# Patient Record
Sex: Female | Born: 1935
Health system: Southern US, Community
[De-identification: ages and names within clinical notes are randomized; demographics above are authoritative.]

## PROBLEM LIST (undated history)

## (undated) DIAGNOSIS — N289 Disorder of kidney and ureter, unspecified: Secondary | ICD-10-CM

## (undated) DIAGNOSIS — E785 Hyperlipidemia, unspecified: Secondary | ICD-10-CM

## (undated) DIAGNOSIS — R002 Palpitations: Secondary | ICD-10-CM

## (undated) DIAGNOSIS — M1990 Unspecified osteoarthritis, unspecified site: Secondary | ICD-10-CM

## (undated) DIAGNOSIS — J189 Pneumonia, unspecified organism: Secondary | ICD-10-CM

## (undated) DIAGNOSIS — R011 Cardiac murmur, unspecified: Secondary | ICD-10-CM

## (undated) DIAGNOSIS — I2510 Atherosclerotic heart disease of native coronary artery without angina pectoris: Secondary | ICD-10-CM

## (undated) DIAGNOSIS — M419 Scoliosis, unspecified: Secondary | ICD-10-CM

## (undated) DIAGNOSIS — IMO0002 Reserved for concepts with insufficient information to code with codable children: Secondary | ICD-10-CM

## (undated) DIAGNOSIS — J309 Allergic rhinitis, unspecified: Secondary | ICD-10-CM

## (undated) DIAGNOSIS — I10 Essential (primary) hypertension: Secondary | ICD-10-CM

## (undated) HISTORY — PX: HEMORRHOID SURGERY: SHX153

## (undated) HISTORY — DX: Pneumonia, unspecified organism: J18.9

## (undated) HISTORY — PX: OTHER SURGICAL HISTORY: SHX169

## (undated) HISTORY — PX: APPENDECTOMY: SHX54

## (undated) HISTORY — DX: Disorder of kidney and ureter, unspecified: N28.9

## (undated) HISTORY — DX: Atherosclerotic heart disease of native coronary artery without angina pectoris: I25.10

## (undated) HISTORY — PX: TONSILLECTOMY: SUR1361

## (undated) HISTORY — PX: ABDOMINAL SURGERY: SHX537

## (undated) HISTORY — DX: Cardiac murmur, unspecified: R01.1

## (undated) HISTORY — DX: Hyperlipidemia, unspecified: E78.5

## (undated) HISTORY — DX: Essential (primary) hypertension: I10

## (undated) HISTORY — PX: ABDOMINAL HYSTERECTOMY: SHX81

## (undated) HISTORY — DX: Reserved for concepts with insufficient information to code with codable children: IMO0002

## (undated) HISTORY — PX: CARPAL TUNNEL RELEASE: SHX101

## (undated) HISTORY — DX: Allergic rhinitis, unspecified: J30.9

## (undated) HISTORY — DX: Palpitations: R00.2

## (undated) HISTORY — PX: COLON SURGERY: SHX602

## (undated) HISTORY — DX: Unspecified osteoarthritis, unspecified site: M19.90

## (undated) HISTORY — DX: Scoliosis, unspecified: M41.9

## (undated) HISTORY — PX: FOOT SURGERY: SHX648

---

## 1997-02-02 HISTORY — PX: BACK SURGERY: SHX140

## 2001-10-10 ENCOUNTER — Other Ambulatory Visit: Admission: RE | Admit: 2001-10-10 | Discharge: 2001-10-10 | Payer: Self-pay | Admitting: Family Medicine

## 2001-10-10 ENCOUNTER — Encounter: Payer: Self-pay | Admitting: Family Medicine

## 2001-10-10 LAB — CONVERTED CEMR LAB: Pap Smear: NORMAL

## 2001-10-17 ENCOUNTER — Encounter: Admission: RE | Admit: 2001-10-17 | Discharge: 2001-10-17 | Payer: Self-pay | Admitting: General Surgery

## 2001-10-17 ENCOUNTER — Encounter: Payer: Self-pay | Admitting: General Surgery

## 2001-11-16 ENCOUNTER — Encounter: Payer: Self-pay | Admitting: General Surgery

## 2001-11-18 ENCOUNTER — Ambulatory Visit (HOSPITAL_COMMUNITY): Admission: RE | Admit: 2001-11-18 | Discharge: 2001-11-18 | Payer: Self-pay | Admitting: General Surgery

## 2003-12-13 ENCOUNTER — Ambulatory Visit: Payer: Self-pay | Admitting: Family Medicine

## 2003-12-19 ENCOUNTER — Ambulatory Visit: Payer: Self-pay | Admitting: Family Medicine

## 2004-06-25 ENCOUNTER — Ambulatory Visit: Payer: Self-pay | Admitting: Family Medicine

## 2004-06-27 ENCOUNTER — Ambulatory Visit: Payer: Self-pay | Admitting: Family Medicine

## 2004-07-30 ENCOUNTER — Ambulatory Visit: Payer: Self-pay | Admitting: Family Medicine

## 2004-11-28 ENCOUNTER — Ambulatory Visit: Payer: Self-pay | Admitting: Internal Medicine

## 2005-09-17 ENCOUNTER — Ambulatory Visit: Payer: Self-pay | Admitting: Family Medicine

## 2005-10-19 ENCOUNTER — Ambulatory Visit: Payer: Self-pay | Admitting: Family Medicine

## 2005-11-16 ENCOUNTER — Ambulatory Visit: Payer: Self-pay | Admitting: Family Medicine

## 2006-02-01 ENCOUNTER — Ambulatory Visit: Payer: Self-pay | Admitting: Family Medicine

## 2006-02-01 ENCOUNTER — Encounter: Admission: RE | Admit: 2006-02-01 | Discharge: 2006-02-01 | Payer: Self-pay | Admitting: Family Medicine

## 2006-02-02 HISTORY — PX: CORONARY ANGIOPLASTY: SHX604

## 2006-02-03 ENCOUNTER — Encounter: Admission: RE | Admit: 2006-02-03 | Discharge: 2006-02-03 | Payer: Self-pay | Admitting: Family Medicine

## 2006-06-03 ENCOUNTER — Encounter: Payer: Self-pay | Admitting: Family Medicine

## 2006-06-05 ENCOUNTER — Emergency Department: Payer: Self-pay | Admitting: Emergency Medicine

## 2006-06-17 ENCOUNTER — Ambulatory Visit: Payer: Self-pay | Admitting: Family Medicine

## 2006-06-21 ENCOUNTER — Encounter: Payer: Self-pay | Admitting: Family Medicine

## 2006-06-21 DIAGNOSIS — N6019 Diffuse cystic mastopathy of unspecified breast: Secondary | ICD-10-CM

## 2006-06-21 DIAGNOSIS — IMO0002 Reserved for concepts with insufficient information to code with codable children: Secondary | ICD-10-CM

## 2006-06-21 DIAGNOSIS — J309 Allergic rhinitis, unspecified: Secondary | ICD-10-CM | POA: Insufficient documentation

## 2006-06-21 DIAGNOSIS — N3941 Urge incontinence: Secondary | ICD-10-CM

## 2006-06-21 DIAGNOSIS — E785 Hyperlipidemia, unspecified: Secondary | ICD-10-CM

## 2006-06-21 DIAGNOSIS — Z9189 Other specified personal risk factors, not elsewhere classified: Secondary | ICD-10-CM | POA: Insufficient documentation

## 2006-06-21 DIAGNOSIS — Z8709 Personal history of other diseases of the respiratory system: Secondary | ICD-10-CM | POA: Insufficient documentation

## 2006-06-21 DIAGNOSIS — I10 Essential (primary) hypertension: Secondary | ICD-10-CM | POA: Insufficient documentation

## 2006-06-22 ENCOUNTER — Ambulatory Visit: Payer: Self-pay | Admitting: Family Medicine

## 2006-06-29 ENCOUNTER — Ambulatory Visit: Payer: Self-pay | Admitting: Family Medicine

## 2006-06-29 DIAGNOSIS — R002 Palpitations: Secondary | ICD-10-CM | POA: Insufficient documentation

## 2006-06-29 DIAGNOSIS — R5383 Other fatigue: Secondary | ICD-10-CM

## 2006-06-29 DIAGNOSIS — R5381 Other malaise: Secondary | ICD-10-CM

## 2006-06-29 DIAGNOSIS — R0989 Other specified symptoms and signs involving the circulatory and respiratory systems: Secondary | ICD-10-CM

## 2006-06-29 DIAGNOSIS — R0609 Other forms of dyspnea: Secondary | ICD-10-CM

## 2006-07-02 ENCOUNTER — Ambulatory Visit: Payer: Self-pay | Admitting: Internal Medicine

## 2006-07-08 ENCOUNTER — Ambulatory Visit: Payer: Self-pay | Admitting: Internal Medicine

## 2006-07-08 ENCOUNTER — Inpatient Hospital Stay (HOSPITAL_BASED_OUTPATIENT_CLINIC_OR_DEPARTMENT_OTHER): Admission: RE | Admit: 2006-07-08 | Discharge: 2006-07-08 | Payer: Self-pay | Admitting: Internal Medicine

## 2006-07-18 ENCOUNTER — Ambulatory Visit: Payer: Self-pay | Admitting: Internal Medicine

## 2006-07-20 ENCOUNTER — Ambulatory Visit: Payer: Self-pay

## 2006-07-20 ENCOUNTER — Encounter: Payer: Self-pay | Admitting: Internal Medicine

## 2006-07-23 ENCOUNTER — Telehealth: Payer: Self-pay | Admitting: Family Medicine

## 2006-07-26 ENCOUNTER — Ambulatory Visit: Payer: Self-pay

## 2006-07-28 ENCOUNTER — Ambulatory Visit: Payer: Self-pay

## 2006-07-28 ENCOUNTER — Ambulatory Visit: Payer: Self-pay | Admitting: Emergency Medicine

## 2006-08-09 ENCOUNTER — Ambulatory Visit: Payer: Self-pay | Admitting: Family Medicine

## 2006-08-09 DIAGNOSIS — R519 Headache, unspecified: Secondary | ICD-10-CM | POA: Insufficient documentation

## 2006-08-09 DIAGNOSIS — R51 Headache: Secondary | ICD-10-CM

## 2006-08-23 ENCOUNTER — Ambulatory Visit: Payer: Self-pay | Admitting: Family Medicine

## 2006-10-20 ENCOUNTER — Ambulatory Visit: Payer: Self-pay | Admitting: Family Medicine

## 2006-10-20 DIAGNOSIS — M7120 Synovial cyst of popliteal space [Baker], unspecified knee: Secondary | ICD-10-CM | POA: Insufficient documentation

## 2006-10-28 ENCOUNTER — Ambulatory Visit: Payer: Self-pay | Admitting: Internal Medicine

## 2006-11-17 ENCOUNTER — Encounter: Payer: Self-pay | Admitting: Family Medicine

## 2006-12-01 ENCOUNTER — Encounter: Payer: Self-pay | Admitting: Family Medicine

## 2006-12-02 ENCOUNTER — Encounter: Admission: RE | Admit: 2006-12-02 | Discharge: 2006-12-02 | Payer: Self-pay | Admitting: Orthopedic Surgery

## 2007-01-11 ENCOUNTER — Encounter: Payer: Self-pay | Admitting: Family Medicine

## 2007-01-14 ENCOUNTER — Encounter: Payer: Self-pay | Admitting: Family Medicine

## 2007-02-03 HISTORY — PX: TOTAL HIP ARTHROPLASTY: SHX124

## 2007-06-14 ENCOUNTER — Ambulatory Visit: Payer: Self-pay | Admitting: Family Medicine

## 2007-06-14 DIAGNOSIS — R609 Edema, unspecified: Secondary | ICD-10-CM | POA: Insufficient documentation

## 2007-06-16 LAB — CONVERTED CEMR LAB
ALT: 20 units/L (ref 0–35)
BUN: 23 mg/dL (ref 6–23)
Bilirubin, Direct: 0.1 mg/dL (ref 0.0–0.3)
CO2: 30 meq/L (ref 19–32)
Calcium: 9.1 mg/dL (ref 8.4–10.5)
Cholesterol: 239 mg/dL (ref 0–200)
Eosinophils Absolute: 0.1 10*3/uL (ref 0.0–0.7)
Eosinophils Relative: 1.4 % (ref 0.0–5.0)
GFR calc Af Amer: 68 mL/min
GFR calc non Af Amer: 56 mL/min
Glucose, Bld: 85 mg/dL (ref 70–99)
MCHC: 34.4 g/dL (ref 30.0–36.0)
MCV: 95.6 fL (ref 78.0–100.0)
Monocytes Absolute: 0.4 10*3/uL (ref 0.1–1.0)
Neutrophils Relative %: 75.6 % (ref 43.0–77.0)
Platelets: 267 10*3/uL (ref 150–400)
RDW: 12.2 % (ref 11.5–14.6)
Total CHOL/HDL Ratio: 4.1

## 2007-07-15 ENCOUNTER — Ambulatory Visit: Payer: Self-pay | Admitting: Family Medicine

## 2007-09-14 ENCOUNTER — Telehealth (INDEPENDENT_AMBULATORY_CARE_PROVIDER_SITE_OTHER): Payer: Self-pay | Admitting: *Deleted

## 2007-10-11 ENCOUNTER — Ambulatory Visit: Payer: Self-pay | Admitting: Family Medicine

## 2007-10-12 ENCOUNTER — Ambulatory Visit: Payer: Self-pay | Admitting: Family Medicine

## 2007-10-12 LAB — CONVERTED CEMR LAB
AST: 28 units/L (ref 0–37)
CO2: 31 meq/L (ref 19–32)
Calcium: 9.4 mg/dL (ref 8.4–10.5)
Chloride: 104 meq/L (ref 96–112)
Creatinine, Ser: 1.1 mg/dL (ref 0.4–1.2)
GFR calc non Af Amer: 50 mL/min
Glucose, Bld: 97 mg/dL (ref 70–99)
HDL: 56.2 mg/dL (ref >39.0)
Phosphorus: 4.8 mg/dL — ABNORMAL HIGH (ref 2.3–4.6)

## 2007-10-31 ENCOUNTER — Ambulatory Visit: Payer: Self-pay | Admitting: Family Medicine

## 2007-11-14 ENCOUNTER — Ambulatory Visit: Payer: Self-pay | Admitting: Family Medicine

## 2007-12-05 ENCOUNTER — Encounter: Payer: Self-pay | Admitting: Family Medicine

## 2007-12-13 ENCOUNTER — Ambulatory Visit: Payer: Self-pay | Admitting: Family Medicine

## 2008-01-02 ENCOUNTER — Inpatient Hospital Stay (HOSPITAL_COMMUNITY): Admission: RE | Admit: 2008-01-02 | Discharge: 2008-01-05 | Payer: Self-pay | Admitting: Orthopedic Surgery

## 2008-01-02 ENCOUNTER — Ambulatory Visit: Payer: Self-pay | Admitting: Cardiology

## 2008-02-03 HISTORY — PX: CATARACT EXTRACTION: SUR2

## 2008-04-18 ENCOUNTER — Ambulatory Visit: Payer: Self-pay | Admitting: Ophthalmology

## 2008-04-18 ENCOUNTER — Encounter: Payer: Self-pay | Admitting: Family Medicine

## 2008-05-02 ENCOUNTER — Ambulatory Visit: Payer: Self-pay | Admitting: Ophthalmology

## 2008-05-15 ENCOUNTER — Ambulatory Visit: Payer: Self-pay | Admitting: Family Medicine

## 2008-08-02 ENCOUNTER — Ambulatory Visit: Payer: Self-pay | Admitting: Ophthalmology

## 2008-08-13 ENCOUNTER — Ambulatory Visit: Payer: Self-pay | Admitting: Ophthalmology

## 2008-09-20 ENCOUNTER — Emergency Department (HOSPITAL_COMMUNITY): Admission: EM | Admit: 2008-09-20 | Discharge: 2008-09-20 | Payer: Self-pay | Admitting: Emergency Medicine

## 2008-09-20 ENCOUNTER — Telehealth: Payer: Self-pay | Admitting: Family Medicine

## 2008-09-21 ENCOUNTER — Telehealth: Payer: Self-pay | Admitting: Family Medicine

## 2008-09-21 DIAGNOSIS — R55 Syncope and collapse: Secondary | ICD-10-CM

## 2008-09-28 ENCOUNTER — Ambulatory Visit: Payer: Self-pay | Admitting: Internal Medicine

## 2008-10-01 ENCOUNTER — Ambulatory Visit: Payer: Self-pay | Admitting: Internal Medicine

## 2008-10-01 ENCOUNTER — Encounter: Payer: Self-pay | Admitting: Internal Medicine

## 2008-10-01 DIAGNOSIS — R42 Dizziness and giddiness: Secondary | ICD-10-CM

## 2008-10-09 ENCOUNTER — Ambulatory Visit: Payer: Self-pay | Admitting: Cardiovascular Disease

## 2008-10-09 ENCOUNTER — Encounter: Payer: Self-pay | Admitting: Family Medicine

## 2008-10-11 LAB — CONVERTED CEMR LAB
CO2: 22 meq/L (ref 19–32)
Calcium: 9.1 mg/dL (ref 8.4–10.5)
Chloride: 105 meq/L (ref 96–112)
Creatinine, Ser: 1.12 mg/dL (ref 0.40–1.20)
MCV: 95.8 fL (ref 78.0–100.0)
Potassium: 4.5 meq/L (ref 3.5–5.3)
RBC: 4.31 M/uL (ref 3.87–5.11)
Sodium: 141 meq/L (ref 135–145)

## 2008-10-12 ENCOUNTER — Ambulatory Visit (HOSPITAL_COMMUNITY): Admission: RE | Admit: 2008-10-12 | Discharge: 2008-10-12 | Payer: Self-pay | Admitting: Internal Medicine

## 2008-10-12 ENCOUNTER — Ambulatory Visit: Payer: Self-pay | Admitting: Internal Medicine

## 2008-10-15 ENCOUNTER — Encounter: Payer: Self-pay | Admitting: Internal Medicine

## 2008-10-16 ENCOUNTER — Ambulatory Visit: Payer: Self-pay

## 2008-10-16 ENCOUNTER — Encounter: Payer: Self-pay | Admitting: Internal Medicine

## 2008-10-22 ENCOUNTER — Ambulatory Visit: Payer: Self-pay | Admitting: Internal Medicine

## 2008-11-13 ENCOUNTER — Encounter (INDEPENDENT_AMBULATORY_CARE_PROVIDER_SITE_OTHER): Payer: Self-pay | Admitting: *Deleted

## 2008-12-17 ENCOUNTER — Ambulatory Visit: Payer: Self-pay | Admitting: Family Medicine

## 2008-12-18 LAB — CONVERTED CEMR LAB
Albumin: 4.2 g/dL (ref 3.5–5.2)
BUN: 30 mg/dL — ABNORMAL HIGH (ref 6–23)
Direct LDL: 192.4 mg/dL
Folate: >20 ng/mL
Glucose, Bld: 86 mg/dL (ref 70–99)
HDL: 53.9 mg/dL (ref >39.00)
Lymphocytes Relative: 21.3 % (ref 12.0–46.0)
MCHC: 34.3 g/dL (ref 30.0–36.0)
MCV: 95 fL (ref 78.0–100.0)
Monocytes Absolute: 0.6 10*3/uL (ref 0.1–1.0)
RBC: 4.12 M/uL (ref 3.87–5.11)
Triglycerides: 146 mg/dL (ref 0.0–149.0)
Vit D, 25-Hydroxy: 22 ng/mL — ABNORMAL LOW (ref 30–89)
Vitamin B-12: >1500 pg/mL — ABNORMAL HIGH (ref 211–911)
WBC: 6.5 10*3/uL (ref 4.5–10.5)

## 2008-12-31 ENCOUNTER — Ambulatory Visit: Payer: Self-pay | Admitting: Family Medicine

## 2008-12-31 DIAGNOSIS — E559 Vitamin D deficiency, unspecified: Secondary | ICD-10-CM | POA: Insufficient documentation

## 2008-12-31 DIAGNOSIS — N259 Disorder resulting from impaired renal tubular function, unspecified: Secondary | ICD-10-CM | POA: Insufficient documentation

## 2008-12-31 LAB — CONVERTED CEMR LAB
Bilirubin Urine: NEGATIVE
Glucose, Urine, Semiquant: NEGATIVE
Urobilinogen, UA: 0.2

## 2009-01-01 LAB — CONVERTED CEMR LAB
Albumin: 4.1 g/dL (ref 3.5–5.2)
CO2: 29 meq/L (ref 19–32)
Creatinine, Ser: 1.1 mg/dL (ref 0.4–1.2)
GFR calc non Af Amer: 50 mL/min (ref >60)
Glucose, Bld: 81 mg/dL (ref 70–99)
Phosphorus: 4.6 mg/dL (ref 2.3–4.6)

## 2009-03-18 ENCOUNTER — Ambulatory Visit: Payer: Self-pay | Admitting: Internal Medicine

## 2009-05-01 ENCOUNTER — Ambulatory Visit: Payer: Self-pay | Admitting: Family Medicine

## 2009-05-01 DIAGNOSIS — I872 Venous insufficiency (chronic) (peripheral): Secondary | ICD-10-CM | POA: Insufficient documentation

## 2009-05-03 LAB — CONVERTED CEMR LAB
ALT: 15 units/L (ref 0–35)
Calcium: 9.4 mg/dL (ref 8.4–10.5)
Chloride: 104 meq/L (ref 96–112)
GFR calc non Af Amer: 45.19 mL/min (ref >60)
Phosphorus: 3.8 mg/dL (ref 2.3–4.6)
Potassium: 4.6 meq/L (ref 3.5–5.1)
Total CHOL/HDL Ratio: 4

## 2009-05-13 ENCOUNTER — Telehealth (INDEPENDENT_AMBULATORY_CARE_PROVIDER_SITE_OTHER): Payer: Self-pay | Admitting: *Deleted

## 2009-05-13 ENCOUNTER — Ambulatory Visit: Payer: Self-pay | Admitting: Family Medicine

## 2009-05-14 ENCOUNTER — Ambulatory Visit: Payer: Self-pay | Admitting: Cardiovascular Disease

## 2009-05-14 ENCOUNTER — Encounter (INDEPENDENT_AMBULATORY_CARE_PROVIDER_SITE_OTHER): Payer: Self-pay | Admitting: *Deleted

## 2009-05-14 ENCOUNTER — Ambulatory Visit: Payer: Self-pay | Admitting: Gastroenterology

## 2009-05-14 ENCOUNTER — Encounter: Payer: Self-pay | Admitting: Physician Assistant

## 2009-05-14 DIAGNOSIS — K5900 Constipation, unspecified: Secondary | ICD-10-CM | POA: Insufficient documentation

## 2009-05-14 DIAGNOSIS — I2510 Atherosclerotic heart disease of native coronary artery without angina pectoris: Secondary | ICD-10-CM | POA: Insufficient documentation

## 2009-05-15 ENCOUNTER — Telehealth: Payer: Self-pay | Admitting: Physician Assistant

## 2009-05-16 ENCOUNTER — Encounter: Payer: Self-pay | Admitting: Gastroenterology

## 2009-05-16 ENCOUNTER — Encounter: Payer: Self-pay | Admitting: Physician Assistant

## 2009-05-17 LAB — CONVERTED CEMR LAB
ALT: 19 units/L (ref 0–35)
AST: 28 units/L (ref 0–37)
Bilirubin Urine: NEGATIVE
CO2: 31 meq/L (ref 19–32)
Chloride: 104 meq/L (ref 96–112)
Creatinine, Ser: 1.1 mg/dL (ref 0.4–1.2)
Eosinophils Absolute: 0.1 10*3/uL (ref 0.0–0.7)
Ketones, ur: NEGATIVE mg/dL
Lymphocytes Relative: 12.3 % (ref 12.0–46.0)
MCHC: 34.1 g/dL (ref 30.0–36.0)
MCV: 92.8 fL (ref 78.0–100.0)
Monocytes Relative: 6.8 % (ref 3.0–12.0)
Neutrophils Relative %: 79.6 % — ABNORMAL HIGH (ref 43.0–77.0)
Nitrite: NEGATIVE
Platelets: 286 10*3/uL (ref 150.0–400.0)
RDW: 13.9 % (ref 11.5–14.6)
Sodium: 143 meq/L (ref 135–145)
Total Bilirubin: 0.7 mg/dL (ref 0.3–1.2)
Urine Glucose: NEGATIVE mg/dL
Urobilinogen, UA: 0.2 (ref 0.0–1.0)
pH: 6.5 (ref 5.0–8.0)

## 2009-05-23 ENCOUNTER — Telehealth: Payer: Self-pay | Admitting: Gastroenterology

## 2009-05-29 ENCOUNTER — Ambulatory Visit: Payer: Self-pay | Admitting: Gastroenterology

## 2009-06-03 ENCOUNTER — Telehealth (INDEPENDENT_AMBULATORY_CARE_PROVIDER_SITE_OTHER): Payer: Self-pay | Admitting: *Deleted

## 2009-06-07 ENCOUNTER — Ambulatory Visit: Payer: Self-pay | Admitting: Gastroenterology

## 2009-06-07 ENCOUNTER — Encounter (INDEPENDENT_AMBULATORY_CARE_PROVIDER_SITE_OTHER): Payer: Self-pay | Admitting: *Deleted

## 2009-06-20 ENCOUNTER — Ambulatory Visit (HOSPITAL_COMMUNITY): Admission: RE | Admit: 2009-06-20 | Discharge: 2009-06-20 | Payer: Self-pay | Admitting: Gastroenterology

## 2009-06-20 ENCOUNTER — Ambulatory Visit: Payer: Self-pay | Admitting: Gastroenterology

## 2009-06-24 ENCOUNTER — Encounter: Payer: Self-pay | Admitting: Gastroenterology

## 2009-09-27 ENCOUNTER — Encounter (INDEPENDENT_AMBULATORY_CARE_PROVIDER_SITE_OTHER): Payer: Self-pay | Admitting: *Deleted

## 2009-10-15 ENCOUNTER — Ambulatory Visit: Payer: Self-pay | Admitting: Family Medicine

## 2009-10-15 ENCOUNTER — Encounter: Payer: Self-pay | Admitting: Gastroenterology

## 2009-10-15 DIAGNOSIS — M25519 Pain in unspecified shoulder: Secondary | ICD-10-CM

## 2009-10-15 DIAGNOSIS — R7309 Other abnormal glucose: Secondary | ICD-10-CM

## 2009-10-16 LAB — CONVERTED CEMR LAB
ALT: 16 units/L (ref 0–35)
AST: 27 units/L (ref 0–37)
Albumin: 3.9 g/dL (ref 3.5–5.2)
BUN: 27 mg/dL — ABNORMAL HIGH (ref 6–23)
Calcium: 9.2 mg/dL (ref 8.4–10.5)
GFR calc non Af Amer: 45.14 mL/min (ref >60)
Glucose, Bld: 84 mg/dL (ref 70–99)
Phosphorus: 4 mg/dL (ref 2.3–4.6)
VLDL: 25 mg/dL (ref 0.0–40.0)

## 2009-10-30 ENCOUNTER — Encounter: Payer: Self-pay | Admitting: Family Medicine

## 2009-11-01 ENCOUNTER — Encounter (INDEPENDENT_AMBULATORY_CARE_PROVIDER_SITE_OTHER): Payer: Self-pay | Admitting: *Deleted

## 2009-11-14 ENCOUNTER — Telehealth: Payer: Self-pay | Admitting: Family Medicine

## 2009-12-06 ENCOUNTER — Ambulatory Visit: Payer: Self-pay | Admitting: Gastroenterology

## 2009-12-06 ENCOUNTER — Encounter (INDEPENDENT_AMBULATORY_CARE_PROVIDER_SITE_OTHER): Payer: Self-pay | Admitting: *Deleted

## 2009-12-06 DIAGNOSIS — Z860100 Personal history of colon polyps, unspecified: Secondary | ICD-10-CM | POA: Insufficient documentation

## 2010-01-19 ENCOUNTER — Inpatient Hospital Stay (HOSPITAL_COMMUNITY): Admission: EM | Admit: 2010-01-19 | Discharge: 2010-01-22 | Payer: Self-pay | Source: Home / Self Care

## 2010-01-19 ENCOUNTER — Encounter: Payer: Self-pay | Admitting: Family Medicine

## 2010-01-20 ENCOUNTER — Encounter: Payer: Self-pay | Admitting: Family Medicine

## 2010-01-20 ENCOUNTER — Telehealth: Payer: Self-pay | Admitting: Family Medicine

## 2010-01-22 ENCOUNTER — Encounter: Payer: Self-pay | Admitting: Family Medicine

## 2010-02-02 HISTORY — PX: HERNIA REPAIR: SHX51

## 2010-02-04 ENCOUNTER — Ambulatory Visit: Admit: 2010-02-04 | Payer: Self-pay | Admitting: Gastroenterology

## 2010-02-27 ENCOUNTER — Telehealth: Payer: Self-pay | Admitting: Family Medicine

## 2010-02-28 ENCOUNTER — Encounter: Payer: Self-pay | Admitting: Family Medicine

## 2010-02-28 ENCOUNTER — Encounter
Admission: RE | Admit: 2010-02-28 | Discharge: 2010-02-28 | Payer: Self-pay | Source: Home / Self Care | Attending: General Surgery | Admitting: General Surgery

## 2010-03-02 LAB — CONVERTED CEMR LAB
BUN: 24 mg/dL — ABNORMAL HIGH (ref 6–23)
Basophils Relative: 0.8 % (ref 0.0–1.0)
CO2: 31 meq/L (ref 19–32)
Chloride: 105 meq/L (ref 96–112)
Cholesterol: 230 mg/dL (ref 0–200)
Direct LDL: 143.8 mg/dL
Eosinophils Absolute: 0.2 10*3/uL (ref 0.0–0.6)
Eosinophils Relative: 2.9 % (ref 0.0–5.0)
GFR calc Af Amer: 55 mL/min
GFR calc non Af Amer: 46 mL/min
Glucose, Bld: 90 mg/dL (ref 70–99)
HCT: 37.4 % (ref 36.0–46.0)
MCHC: 34.4 g/dL (ref 30.0–36.0)
Neutrophils Relative %: 65.8 % (ref 43.0–77.0)
Phosphorus: 4.5 mg/dL (ref 2.3–4.6)
Potassium: 4.3 meq/L (ref 3.5–5.1)
RDW: 15.6 % — ABNORMAL HIGH (ref 11.5–14.6)
TSH: 1.16 microintl units/mL (ref 0.35–5.50)
Total CHOL/HDL Ratio: 5
Triglycerides: 155 mg/dL — ABNORMAL HIGH (ref 0–149)
VLDL: 31 mg/dL (ref 0–40)

## 2010-03-04 NOTE — Procedures (Signed)
Summary: Instruction for procedure/[facility] (out pt)  Instruction for procedure/[facility] (out pt)   Imported By: Sherian Rein 06/11/2009 11:04:05  _____________________________________________________________________  External Attachment:    Type:   Image     Comment:   External Document

## 2010-03-04 NOTE — Miscellaneous (Signed)
Summary: RX MOVIPREP  Clinical Lists Changes  Medications: Added new medication of MOVIPREP 100 GM  SOLR (PEG-KCL-NACL-NASULF-NA ASC-C) As per prep instructions. - Signed Rx of MOVIPREP 100 GM  SOLR (PEG-KCL-NACL-NASULF-NA ASC-C) As per prep instructions.;  #1 x 0;  Signed;  Entered by: Lowry Ram NCMA;  Authorized by: Sammuel Cooper PA-c;  Method used: Electronically to [facility] [phone]*, [address] [place], [place], [place]  [address], Ph: [phone], Fax: [phone]    Prescriptions: MOVIPREP 100 GM  SOLR (PEG-KCL-NACL-NASULF-NA ASC-C) As per prep instructions.  #1 x 0   Entered by:   Lowry Ram NCMA   Authorized by:   Sammuel Cooper PA-c   Signed by:   Lowry Ram NCMA on 05/16/2009   Method used:   Electronically to        [facility] [phone]* (retail)       [address] [place], [place]  [address]       Ph: [phone]       Fax: [phone]   RxID:   [id number]

## 2010-03-04 NOTE — Progress Notes (Signed)
Summary: needs order for zostavax  Phone Note From Pharmacy   Caller: [facility]  [place]. [phone] Reason for Call: Allergy Alert Summary of Call: Pt is requesting to get zostavax at [facility], please send order. Initial call taken by: Lowella Petties CMA,  November 14, 2009 2:39 PM  Follow-up for Phone Call        px written on EMR for call in  Follow-up by: Judith Part MD,  November 15, 2009 9:45 AM  Additional Follow-up for Phone Call Additional follow up Details #1::        Medication phoned to [facility] [phone] pharmacy as instructed. The note for reason for call allergy alert, spoke with Jacki Cones and she said that was error.Lewanda Rife LPN  November 15, 2009 9:58 AM     New/Updated Medications: ZOSTAVAX 82956 UNT/0.65ML SOLR (ZOSTER VACCINE LIVE) inject times one as directed Prescriptions: ZOSTAVAX 21308 UNT/0.65ML SOLR (ZOSTER VACCINE LIVE) inject times one as directed  #1 x 0   Entered and Authorized by:   Judith Part MD   Signed by:   Lewanda Rife LPN on 65/78/4696   Method used:   Telephoned to ...       [facility] [phone]* (retail)       [address] [place], [place]  [address]       Ph: [phone]       Fax: [phone]   RxID:   [id number]

## 2010-03-04 NOTE — Assessment & Plan Note (Signed)
Summary: FOLLOW UP / LFW   Vital Signs:  Patient profile:   77 year old female Height:      59.75 inches Weight:      140 pounds Temp:     98.2 degrees F oral Pulse rate:   60 / minute Pulse rhythm:   regular BP sitting:   140 / 70  (left arm) Cuff size:   regular  Vitals Entered By: Linde Gillis CMA Duncan Dull) (May 26, 2009 1:50 PM) CC: follow-up visit   History of Present Illness: here for f/u of HTN and lipids and renal insuff  has had some syncope with cardiac w/u  no events found hesitant to get better bp control in light of labile bp unless sytolic exceeds 045W   wt is up 6 lb-- is swelling just a bit in her ankles  also eating more than usual  too much bread   due for chol check Last Lipid ProfileCholesterol: 263 (12/17/2008 11:58:27 AM)HDL:  53.90 (12/17/2008 11:58:27 AM)LDL:  118 (10/11/2007 8:59:00 AM)Triglycerides:  Last Liver profileSGOT:  28 (10/11/2007 8:59:00 AM)SPGT:  18 (10/11/2007 8:59:00 AM)T. Bili:  1.0 (06/14/2007 12:28:00 PM)Alk Phos:  50 (06/14/2007 12:28:00 PM)   cr went down with better hydration drinking more sends her to the toilet all the time and that is annoying  a lot of dribbling , and incontinence  does not want to see urologist or evaluate it however  bp not too bad today 140/70   (systolic has been 133 at home)   needs bactroban oint px for cuts and scrapes - needs refil of that / works much better than neosporin  still feels head pain and funny feeling since hitting head in december  was hosp and had scans is interested in neurologist ref for this and syncope   some occas numbness in L hand - wears medi alert bracelet on that hand- had carpal tunnel    Allergies: 1)  ! Crestor (Rosuvastatin Calcium) 2)  ! Zocor (Simvastatin) 3)  ! Aspirin (Aspirin) 4)  ! Detrol La (Tolterodine Tartrate)  Past History:  Past Medical History: Last updated: 12/31/2008 Allergic rhinitis Hyperlipidemia Hypertension Pneumonia, hx of deg disc  dz with injections  non-obstructive CAD cath 2008 with med tx     --LAD 40% LCx ok RCA 30% EF 60% Osteoarthritis s/p R hip replacement. renal insufficiency  neurosx--Dr Elenore Paddy [facility]-- [facility] [place] for [facility] surg)   Past Surgical History: Last updated: 05/15/2008 Appendectomy Carpal tunnel release Hemorrhoidectomy Hysterectomy Tonsillectomy Abd surgery for benign fatty tumors, also took bilateral ovaries and gallbladder Back surgery- herniated disk (1999) Cardiac stress test (1999) Dexa- normal (06/2003) Bladder tack  Colonoscopy- polyps, hemorrhoids (12/2005) hip replacement 09 cataract surgery 2010  Family History: Last updated: 2009-05-26 Father: deceased, MI age 59 Mother: HTN Siblings:  uncles with throat ca brother alz   Social History: Last updated: 07/15/2007 Marital Status: widowed Children: 5 Occupation: Home nonsmoker no alcohol  Risk Factors: Smoking Status: never (06/21/2006)  Family History: Father: deceased, MI age 81 Mother: HTN Siblings:  uncles with throat ca brother alz   Review of Systems General:  Denies fatigue, fever, loss of appetite, and malaise. Eyes:  Denies blurring and eye irritation. CV:  Denies chest pain or discomfort, lightheadness, palpitations, and shortness of breath with exertion. Resp:  Denies cough, shortness of breath, and wheezing. GI:  Denies abdominal pain, bloody stools, change in bowel habits, indigestion, and nausea. GU:  Complains of nocturia and urinary frequency; denies dysuria and  hematuria. MS:  Complains of joint pain and stiffness; denies cramps and muscle weakness. Derm:  Denies itching, lesion(s), and rash. Neuro:  Complains of headaches; denies numbness, tingling, visual disturbances, and weakness. Psych:  Denies anxiety and depression. Endo:  Denies cold intolerance, excessive thirst, excessive urination, and heat intolerance. Heme:  Denies abnormal bruising and bleeding.  Physical  Exam  General:  elderly well appearing female with cane Head:  no facial or temporal tenderness normocephalic, atraumatic, no abnormalities observed, and no abnormalities palpated.   Eyes:  vision grossly intact, pupils equal, pupils round, and pupils reactive to light.  no nystagmus  Ears:  R ear normal and L ear normal.   Nose:  no nasal discharge.   Mouth:  pharynx pink and moist.   Neck:  supple with full rom and no masses or thyromegally, no JVD or carotid bruit  Chest Wall:  No deformities, masses, or tenderness noted. Lungs:  Normal respiratory effort, chest expands symmetrically. Lungs are clear to auscultation, no crackles or wheezes. Heart:  Normal rate and regular rhythm. S1 and S2 normal without gallop, murmur, click, rub or other extra sounds. Abdomen:  Bowel sounds positive,abdomen soft and non-tender without masses, organomegaly or hernias noted. Msk:  gait favors L side with some kyphoscoliosis  no cva tenderness  Pulses:  R and L carotid,radial,femoral,dorsalis pedis and posterior tibial pulses are full and equal bilaterally Extremities:  varicosities- compressible and trace edema below knee  Neurologic:  cranial nerves II-XII intact, strength normal in all extremities, sensation intact to light touch, and DTRs symmetrical and normal.  no focal cerebellar signs  Skin:  Intact without suspicious lesions or rashes nl color and turgor  Cervical Nodes:  No lymphadenopathy noted Inguinal Nodes:  No significant adenopathy Psych:  normal affect, talkative and pleasant    Impression & Recommendations:  Problem # 1:  VENOUS INSUFFICIENCY (ICD-459.81) Assessment New  with mild pain and edema of legs  will try mild supp hose to knee and update  Orders: Prescription Created Electronically [phone])  Problem # 2:  HEADACHE (ICD-784.0) Assessment: New after head inj and syncope - with neg cardiac w/u so far ref to neurology disc poss of concussion and red flags to call for   Her updated medication list for this problem includes:    Tylenol 325 Mg Tabs (Acetaminophen) ..... Otc as directed.  Orders: Neurology Referral (Neuro) Prescription Created Electronically [id number])  Problem # 3:  RENAL INSUFFICIENCY (ICD-588.9) Assessment: Improved better with more water enc her to continue this  lab today Orders: TLB-Renal Function Panel (80069-RENAL) Prescription Created Electronically [phone])  Problem # 4:  HYPERTENSION (ICD-401.9) Assessment: Unchanged  bp is borderline but cannot adv tx due to orthostatic change and syncope will continue cardiol f/u Her updated medication list for this problem includes:    Lotrel 10-40 Mg Caps (Amlodipine besy-benazepril hcl) .Marland Kitchen... 1 by mouth daily  Orders: Venipuncture (91478) TLB-Renal Function Panel (80069-RENAL) TLB-Lipid Panel (80061-LIPID) TLB-ALT (SGPT) (84460-ALT) TLB-AST (SGOT) (84450-SGOT) Prescription Created Electronically [phone])  BP today: 140/70 Prior BP: 172/82 (03/18/2009)  Labs Reviewed: K+: 4.4 (12/31/2008) Creat: : 1.1 (12/31/2008)   Chol: 263 (12/17/2008)   HDL: 53.90 (12/17/2008)   LDL: 118 (10/11/2007)   TG: 146.0 (12/17/2008)  Complete Medication List: 1)  Lotrel 10-40 Mg Caps (Amlodipine besy-benazepril hcl) .Marland Kitchen.. 1 by mouth daily 2)  4 Prong Cane  .... To assist with ambulation 3)  Potassium Chloride Crys Cr 20 Meq Tbcr (Potassium chloride crys cr) .... Take one by  mouth daily 4)  Cane  .... Dx 722.6 degenerative disk disease use cane for ambulation as needed 5)  Aloe Vera 470 Mg Caps (Aloe vera) .Marland Kitchen.. 1 by mouth once daily (otc) 6)  Century Mature Tabs (Multiple vitamins-minerals) .Marland Kitchen.. 1 by mouth once daily 7)  Stool Softener 100 Mg Caps (Docusate sodium) .Marland Kitchen.. 1 by mouth once daily 8)  Ginkgo Biloba 120 Mg Caps (Ginkgo biloba) .Marland Kitchen.. 1 by mouth as needed 9)  Tylenol 325 Mg Tabs (Acetaminophen) .... Otc as directed. 10)  Vitamin D 2000 Unit Tabs (Cholecalciferol) .... One tablet daily 11)   Vitamin D (ergocalciferol) 50000 Unit Caps (Ergocalciferol) .Marland Kitchen.. 1 by mouth once weekly for 12 weeks 12)  Bactroban 2 % Oint (Mupirocin) .... Apply to affected area two times a day as needed 13)  Support Stockings 5-10 Mm Hg To Knee  .... For swelling and pain and venous insufficiency in legs to wear daily  Patient Instructions: 1)  we will not increase blood pressure medicine - in light of fainting episodes 2)  I refilled your skin ointment  3)  I sent all your px to your pharmacy  4)  switch your medi- alert bracelet to other wrist -- this may be aggrivating your carpal tunnel in the L hand  5)  we will do neurology referral at check out  6)  follow up in about 6 months unless needed earlier  7)  labs today  8)  try light support stockings to the knee for leg pain and swelling  Prescriptions: SUPPORT STOCKINGS 5-10 MM HG TO KNEE for swelling and pain and venous insufficiency in legs to wear daily  #1 pr x 3   Entered and Authorized by:   Judith Part MD   Signed by:   Judith Part MD on 05/01/2009   Method used:   Print then Give to Patient   RxID:   [id number] POTASSIUM CHLORIDE CRYS CR 20 MEQ  TBCR (POTASSIUM CHLORIDE CRYS CR) take one by mouth daily  #90 x 3   Entered and Authorized by:   Judith Part MD   Signed by:   Judith Part MD on 05/01/2009   Method used:   Electronically to        [facility] [phone]* (retail)       [address] [place], [place]  [address]       Ph: [phone]       Fax: [phone]   RxID:   [id number] LOTREL 10-40 MG CAPS (AMLODIPINE BESY-BENAZEPRIL HCL) 1 by mouth daily  #90 x 3   Entered and Authorized by:   Judith Part MD   Signed by:   Judith Part MD on 05/01/2009   Method used:   Electronically to        [facility] [phone]* (retail)       [address] [place], [place]  [address]       Ph: [phone]       Fax: [phone]   RxID:    [id number] BACTROBAN 2 % OINT (MUPIROCIN) apply to affected area two times a day as needed  #1 medium x 1   Entered and Authorized by:   Judith Part MD   Signed by:   Colon Flattery  Sussie Minor MD on 05/01/2009   Method used:   Electronically to        [facility] [phone]* (retail)       [address] [place], [place]  [address]       Ph: [phone]       Fax: [phone]   RxID:   [id number]   Current Allergies (reviewed today): ! CRESTOR (ROSUVASTATIN CALCIUM) ! ZOCOR (SIMVASTATIN) ! ASPIRIN (ASPIRIN) ! DETROL LA (TOLTERODINE TARTRATE)

## 2010-03-04 NOTE — Letter (Signed)
Summary: Results Letter  [facility]  [address] [place], [place] [address]   Phone: [phone]  Fax: [phone]        Jun 24, 2009 MRN: [id number]    [name] [address] [place], [place]  [address]    Dear Ms. Herrle,   The polyp(s) removed during your recent procedure were proven to be adenomatous.  These are pre-cancerous polyps that may have grown into cancers if they had not been removed.  Based on current nationally recognized surveillance guidelines, I recommend that you have a repeat colonoscopy in 6 months.   We will therefore put your information in our reminder system and will contact you in 6 months to schedule a repeat procedure.  Please call if you have any questions or concerns.       Sincerely,  Rachael Fee MD  This letter has been electronically signed by your physician.  Appended Document: Results Letter letter mailed

## 2010-03-04 NOTE — Consult Note (Signed)
Summary: Oletta Lamas MD  Oletta Lamas MD   Imported By: Lanelle Bal 11/25/2009 08:17:02  _____________________________________________________________________  External Attachment:    Type:   Image     Comment:   External Document

## 2010-03-04 NOTE — Procedures (Signed)
Summary: Colonoscopy  COLONOSCOPY PROCEDURE REPORT  PATIENT:  [name], [name]  MR#:  [id number] BIRTHDATE:   1920/05/21, 88 yrs. old   GENDER:   female ENDOSCOPIST:   Rachael Fee, MD REF. BY: Vania Rea. Jarold Motto, M.D. PROCEDURE DATE:  06/20/2009 PROCEDURE:  Colonoscopy with snare polypectomy ASA CLASS:   Class II INDICATIONS: villous polyp noted on colonoscopy by Dr. Jarold Motto last month MEDICATIONS:    Fentanyl 75 mcg IV, Versed 6 mg IV  DESCRIPTION OF PROCEDURE:   After the risks benefits and alternatives of the procedure were thoroughly explained, informed consent was obtained.  Digital rectal exam was performed and revealed no rectal masses.   The  endoscope was introduced through the anus and advanced to the cecum, which was identified by both the appendix and ileocecal valve, without limitations.  The quality of the prep was good, using MoviPrep.  The instrument was then slowly withdrawn as the colon was fully examined. <<PROCEDUREIMAGES>>                <<OLD IMAGES>>  FINDINGS:  Two polyps were found, both were removed and sent to pathology. One was villous, flat, on top of a fold in cecum, 2-3cm across. This was removed in piecemeal fashion with snare (some pieces with cautery, some without cautery). Pathology Jar 1. The other was 7mm, sessile, located in transverse colon, removed with snare/cautery, pathology jar 2 (see image3, image4, image5, image7, and image9).  Mild diverticulosis was found (see image1).  Melanosis coli was found throughout the colon (see image2).  This was otherwise a normal examination of the colon (see image2, image6, and image11).   Retroflexed views in the rectum revealed no abnormalities.    The scope was then withdrawn from the patient and the procedure completed.  COMPLICATIONS:   None ENDOSCOPIC IMPRESSION:  1) Two polyps found, both removed and sent to pathology.  Largest was previously noted cecal polyp, this required piecemeal resection to  remove.  2) Mild diverticulosis  3) Melanosis throughout the colon  4) Otherwise normal examination  RECOMMENDATIONS:  Await final pathology.  Given piecemeal resection of cecal polyp, will likely recommend repeat colonoscopy in 6 months.   _______________________________ Rachael Fee, MD   Appended Document: Colonoscopy thanks

## 2010-03-04 NOTE — Assessment & Plan Note (Signed)
Review of gastrointestinal problems: 1. Large TVA in cecum noted first April 2011  colonoscopy April 2011 by Dr. Jarold Abbott;  polyp was biopsied but not removed (referred to Dr. Christella Abbott for polyp removal) Colonoscopy May 2011 by Dr. Christella Abbott:  2-3cm TVA removed piecmeal, another TA in transverse colon removed   History of Present Illness Visit Type: Follow-up Visit Primary GI MD: Sherri Bunting MD Primary [name]: Sherri Manns, MD Requesting [name]: n/a Chief Complaint: follow up History of Present Illness:     pleasant 77 year old woman who is here with her daughter today. She had a large tubulovillous adenoma piecemeal resected from her colon about 6 months ago and I recommended she have a repeat exam around now.  She is actually most interested in discussing her continued problems with chronic constipation. has been taking miralax every (one scoop) night and magnesium.  She will have a BM only once every 3-4 days. takes laxatives periodically and also a stool softner.           Current Medications (verified): 1)  Lotrel 10-40 Mg Caps (Amlodipine Besy-Benazepril Hcl) .Marland Kitchen.. 1 By Mouth Daily 2)  4 Prong Cane .... To Assist With Ambulation 3)  Potassium Chloride Crys Cr 20 Meq  Tbcr (Potassium Chloride Crys Cr) .... Take One By Mouth Daily 4)  Cane .... Dx 722.6 Degenerative Disk Disease Use Cane For Ambulation As Needed 5)  Aloe Vera 470 Mg Caps (Aloe Vera) .Marland Kitchen.. 1 By Mouth Once Daily (Otc) 6)  Century Mature  Tabs (Multiple Vitamins-Minerals) .Marland Kitchen.. 1 By Mouth Once Daily 7)  Stool Softener 100 Mg Caps (Docusate Sodium) .Marland Kitchen.. 1 By Mouth Once Daily 8)  Ginkgo Biloba 120 Mg Caps (Ginkgo Biloba) .Marland Kitchen.. 1 By Mouth As Needed 9)  Tylenol 325 Mg Tabs (Acetaminophen) .... Otc As Directed. 10)  Vitamin D 2000 Unit Tabs (Cholecalciferol) .... One Tablet Daily 11)  Bactroban 2 % Oint (Mupirocin) .... Apply To Affected Area Two Times A Day As Needed 12)  Support Stockings 5-10 Mm Hg To Knee .... For  Swelling and Pain and Venous Insufficiency in Legs To Wear Daily 13)  Miralax  Pack (Polyethylene Glycol 3350) .... Otc As Directed. 14)  Prozac 10 Mg Caps (Fluoxetine Hcl) 15)  Zostavax 71696 Unt/0.38ml Solr (Zoster Vaccine Live) .... Inject Times One As Directed  Allergies (verified): 1)  ! Crestor (Rosuvastatin Calcium) 2)  ! Zocor (Simvastatin) 3)  ! Aspirin (Aspirin) 4)  ! Detrol La (Tolterodine Tartrate)  Vital Signs:  Patient profile:   77 year old female Height:      59.7 inches Weight:      133 pounds BMI:     26.33 BSA:     1.57 Pulse rate:   76 / minute Pulse rhythm:   regular BP sitting:   132 / 68  Vitals Entered By: [name] CMA [name]) (December 06, 2009 10:42 AM)  Physical Exam  Additional Exam:  Constitutional: generally well appearing Psychiatric: alert and oriented times 3 Abdomen: soft, non-tender, non-distended, normal bowel sounds    Impression & Recommendations:  Problem # 1:  chronic constipation, large tubulovillous adenoma in cecum 6 months ago. I recommended that she increase her MiraLax to 2 scoops a day and also begin a fiber supplement on a daily basis. She said she wanted me to repeat the colonoscopy to figure out what is going on however I explained to her and her daughter very clearly that I think a colonoscopy is a good idea to followup on  the polyp resection site from 6 months ago however repeat colonoscopy will almost certainly not had any information that can help treat her chronic constipation. She understands and wishes to proceed with colonoscopy for polyp surveillance, piecemeal resected polyp.  Patient Instructions: 1)  You will be scheduled to have a colonoscopy in [facility] to reexamine the cecal polyp site. 2)  Resume miralax at two scoops a day. 3)  You should begin taking citrucel powder fiber supplement (orange flavor).  Start with a small spoonful and increase this over 1 week to a full, heaping spoonful daily.  You may notice  some bloating when you first start the fiber, but that usually resolves after a few days. 4)  A copy of this information will be sent to Dr. Sheryn Abbott, Dr. Marcelyn Abbott. 5)  The medication list was reviewed and reconciled.  All changed / newly prescribed medications were explained.  A complete medication list was provided to the patient / caregiver.  Appended Document: Orders Update/Movi    Clinical Lists Changes  Problems: Added new problem of COLONIC POLYPS, HX OF (ICD-V12.72) Medications: Added new medication of MOVIPREP 100 GM  SOLR (PEG-KCL-NACL-NASULF-NA ASC-C) As per prep instructions. - Signed Rx of MOVIPREP 100 GM  SOLR (PEG-KCL-NACL-NASULF-NA ASC-C) As per prep instructions.;  #1 x 0;  Signed;  Entered by: [name] CMA (AAMA);  Authorized by: Sherri Fee MD;  Method used: Electronically to [facility]. [phone]*, [address] [place], [place], [place]  [address], Ph: [phone], Fax: [phone] Orders: Added new Test order of Colonoscopy (Colon) - Signed    Prescriptions: MOVIPREP 100 GM  SOLR (PEG-KCL-NACL-NASULF-NA ASC-C) As per prep instructions.  #1 x 0   Entered by:   [name] CMA (AAMA)   Authorized by:   Sherri Fee MD   Signed by:   [name] CMA (AAMA) on 12/06/2009   Method used:   Electronically to        [facility] [phone]* (retail)       [address] [place], [place]  [address]       Ph: [phone]       Fax: [phone]   RxID:   [id number]

## 2010-03-04 NOTE — Assessment & Plan Note (Signed)
Summary: personal/alc   Vital Signs:  Patient profile:   77 year old female Height:      59.75 inches Weight:      137 pounds Temp:     98.4 degrees F oral Pulse rate:   80 / minute Pulse rhythm:   regular BP sitting:   168 / 76  (left arm) Cuff size:   regular  Vitals Entered By: Lewanda Rife LPN (May 13, 2009 10:14 AM) CC: Rectal pain and feels bulging at rectum. Last normal BM was 05/07/09 and since some diarhea or watery stool   History of Present Illness: has some sore bottom and some bulging too   has urge to have bowel movement -- then just has soft to watery stool if any  does use some stool softeners   hemorroid operation years ago   colonosc was ok years ok   some low abd discomfort this am  no fever at all no blood in stool  is in bathroom all l day with need to have bowel movement   some urine hesitancy  no burning to urinate   Allergies: 1)  ! Crestor (Rosuvastatin Calcium) 2)  ! Zocor (Simvastatin) 3)  ! Aspirin (Aspirin) 4)  ! Detrol La (Tolterodine Tartrate)  Past History:  Past Medical History: Last updated: 12/31/2008 Allergic rhinitis Hyperlipidemia Hypertension Pneumonia, hx of deg disc dz with injections  non-obstructive CAD cath 2008 with med tx     --LAD 40% LCx ok RCA 30% EF 60% Osteoarthritis s/p R hip replacement. renal insufficiency  neurosx--Dr Elenore Paddy ortho-- [facility] [place] for [place] [facility])   Past Surgical History: Last updated: 05/15/2008 Appendectomy Carpal tunnel release Hemorrhoidectomy Hysterectomy Tonsillectomy Abd surgery for benign fatty tumors, also took bilateral ovaries and gallbladder Back surgery- herniated disk (1999) Cardiac stress test (1999) Dexa- normal (06/2003) Bladder tack  Colonoscopy- polyps, hemorrhoids (12/2005) hip replacement 09 cataract surgery 2010  Family History: Last updated: May 13, 2009 Father: deceased, MI age 102 Mother: HTN Siblings:  uncles with throat ca brother alz    Social History: Last updated: 07/15/2007 Marital Status: widowed Children: 5 Occupation: Home nonsmoker no alcohol  Risk Factors: Smoking Status: never (06/21/2006)  Review of Systems General:  Denies chills, fatigue, fever, loss of appetite, and malaise. Eyes:  Denies blurring and eye irritation. CV:  Denies chest pain or discomfort and palpitations. Resp:  Denies cough and wheezing. GI:  Complains of change in bowel habits and diarrhea; denies abdominal pain, bloody stools, dark tarry stools, indigestion, loss of appetite, nausea, and vomiting. GU:  Denies discharge, dysuria, hematuria, and urinary frequency. MS:  Complains of joint pain and stiffness. Derm:  Denies itching, lesion(s), poor wound healing, and rash. Neuro:  Denies numbness and tingling. Psych:  Denies anxiety and depression. Heme:  Denies abnormal bruising and enlarge lymph nodes.  Physical Exam  General:  Well-developed,well-nourished,in no acute distress; alert,appropriate and cooperative throughout examination Head:  normocephalic, atraumatic, and no abnormalities observed.   Eyes:  vision grossly intact, pupils equal, pupils round, and pupils reactive to light.  no conjunctival pallor, injection or icterus  Ears:  R ear normal.   Mouth:  pharynx pink and moist.   Neck:  supple with full rom and no masses or thyromegally, no JVD or carotid bruit  Lungs:  Normal respiratory effort, chest expands symmetrically. Lungs are clear to auscultation, no crackles or wheezes. Heart:  Normal rate and regular rhythm. S1 and S2 normal without gallop, murmur, click, rub or other extra sounds. Abdomen:  mild suprapubic tenderness without rebound or gaurding  Rectal:  small ext hem- non thrombosed extremely tender rectal exam wiht soft stool in vault- no M felt stool heme neg  anoscopy not attempted due to pain Extremities:  varicosities- compressible and trace edema below knee  Neurologic:  gait normal and DTRs  symmetrical and normal.   Skin:  Intact without suspicious lesions or rashes Cervical Nodes:  No lymphadenopathy noted Inguinal Nodes:  No significant adenopathy Psych:  normal affect, talkative and pleasant    Impression & Recommendations:  Problem # 1:  RECTAL PAIN (QIH-474.25) Assessment New with heme pos stool small ext hemorroid and marked pain on rectal exam without mass felt  pt c/o feeling obstructed as well  will tx with anusol hc suppos for symptoms and ref to GI asap  def anoscopy today due to pain on exam  Orders: Gastroenterology Referral (GI)  Problem # 2:  FECAL OCCULT BLOOD (ICD-792.1) Assessment: New see above  Orders: Gastroenterology Referral (GI)  Complete Medication List: 1)  Lotrel 10-40 Mg Caps (Amlodipine besy-benazepril hcl) .Marland Kitchen.. 1 by mouth daily 2)  4 Prong Cane  .... To assist with ambulation 3)  Potassium Chloride Crys Cr 20 Meq Tbcr (Potassium chloride crys cr) .... Take one by mouth daily 4)  Cane  .... Dx 722.6 degenerative disk disease use cane for ambulation as needed 5)  Aloe Vera 470 Mg Caps (Aloe vera) .Marland Kitchen.. 1 by mouth once daily (otc) 6)  Century Mature Tabs (Multiple vitamins-minerals) .Marland Kitchen.. 1 by mouth once daily 7)  Stool Softener 100 Mg Caps (Docusate sodium) .Marland Kitchen.. 1 by mouth once daily 8)  Ginkgo Biloba 120 Mg Caps (Ginkgo biloba) .Marland Kitchen.. 1 by mouth as needed 9)  Tylenol 325 Mg Tabs (Acetaminophen) .... Otc as directed. 10)  Vitamin D 2000 Unit Tabs (Cholecalciferol) .... One tablet daily 11)  Bactroban 2 % Oint (Mupirocin) .... Apply to affected area two times a day as needed 12)  Support Stockings 5-10 Mm Hg To Knee  .... For swelling and pain and venous insufficiency in legs to wear daily 13)  Lidamantle Hc 3-0.5 % Crea (Lidocaine-hydrocortisone ace) .... Use rectally 3-4 times daily as needed for rectal pain. 14)  Miralax Powd (Polyethylene glycol 3350) .... As per prep  instructions. 15)  Moviprep 100 Gm Solr (Peg-kcl-nacl-nasulf-na  asc-c) .... As per prep instructions.  Patient Instructions: 1)  we will do GI consult at check out  2)  use the anusol hc suppository daily for 10 days- to calm down symptoms  3)  keep up water intake  4)  update me in meantime if worse  Prescriptions: ANUSOL-HC 25 MG SUPP (HYDROCORTISONE ACETATE) 1 suppository per rectum at bedtime for 10 days  #10 x 0   Entered and Authorized by:   Judith Part MD   Signed by:   Judith Part MD on 05/13/2009   Method used:   Electronically to        [facility] [phone]* (retail)       [address] [place], [place]  [address]       Ph: [phone]       Fax: [phone]   RxID:   [id number]   Current Allergies (reviewed today): ! CRESTOR (ROSUVASTATIN CALCIUM) ! ZOCOR (SIMVASTATIN) ! ASPIRIN (ASPIRIN) ! DETROL LA (TOLTERODINE TARTRATE)

## 2010-03-04 NOTE — Miscellaneous (Signed)
Summary: flu shot at [facility]  Clinical Lists Changes  Observations: Added new observation of FLU VAX: Historical (09/26/2009 8:54)      Influenza Immunization History:    Influenza # 1:  Historical (09/26/2009) Pt [facility], [place].           Lowella Petties CMA  September 27, 2009 8:55 AM.

## 2010-03-04 NOTE — Progress Notes (Signed)
Summary: speak to nurse  Phone Note Call from Patient Call back at Home Phone [phone]   Caller: Patient Call For: Jarold Motto Reason for Call: Talk to Nurse Summary of Call: Patient has questions regarding her appt 4-26 and 4-27 Initial call taken by: Tawni Levy,  May 23, 2009 10:24 AM  Follow-up for Phone Call        Pt has appt for OV on 05/28/09 and colon on 05/29/09.  Per Mike Gip, PA pt can cancel OV appt.  Pt notified. Follow-up by: Ashok Cordia RN,  May 23, 2009 12:29 PM

## 2010-03-04 NOTE — Assessment & Plan Note (Signed)
Review of gastrointestinal problems: 1. 3-4 cm villous appearing adenoma in cecum, colonoscopy April 2011 by Dr. Jarold Motto; not removed    History of Present Illness Visit Type: Follow-up Visit Primary GI MD: Sheryn Bison MD FACP FAGA Primary Provider: Roxy Manns, MD Requesting Provider: n/a Chief Complaint: F/u repeat colon History of Present Illness:     very pleasant 77 year old woman who underwent a colonoscopy with Dr. Jarold Motto last week. He found a good sized villous-appearing adenoma in her cecum. This was not removed or biopsied. She is here today to discuss repeat colonoscopy attempt endoscopic removal.           Current Medications (verified): 1)  Lotrel 10-40 Mg Caps (Amlodipine Besy-Benazepril Hcl) .Marland Kitchen.. 1 By Mouth Daily 2)  4 Prong Cane .... To Assist With Ambulation 3)  Potassium Chloride Crys Cr 20 Meq  Tbcr (Potassium Chloride Crys Cr) .... Take One By Mouth Daily 4)  Cane .... Dx 722.6 Degenerative Disk Disease Use Cane For Ambulation As Needed 5)  Aloe Vera 470 Mg Caps (Aloe Vera) .Marland Kitchen.. 1 By Mouth Once Daily (Otc) 6)  Century Mature  Tabs (Multiple Vitamins-Minerals) .Marland Kitchen.. 1 By Mouth Once Daily 7)  Stool Softener 100 Mg Caps (Docusate Sodium) .Marland Kitchen.. 1 By Mouth Once Daily 8)  Ginkgo Biloba 120 Mg Caps (Ginkgo Biloba) .Marland Kitchen.. 1 By Mouth As Needed 9)  Tylenol 325 Mg Tabs (Acetaminophen) .... Otc As Directed. 10)  Vitamin D 2000 Unit Tabs (Cholecalciferol) .... One Tablet Daily 11)  Bactroban 2 % Oint (Mupirocin) .... Apply To Affected Area Two Times A Day As Needed 12)  Support Stockings 5-10 Mm Hg To Knee .... For Swelling and Pain and Venous Insufficiency in Legs To Wear Daily 13)  Lidamantle Hc 3-0.5 % Crea (Lidocaine-Hydrocortisone Ace) .... Use Rectally 3-4 Times Daily As Needed For Rectal Pain.  Allergies (verified): 1)  ! Crestor (Rosuvastatin Calcium) 2)  ! Zocor (Simvastatin) 3)  ! Aspirin (Aspirin) 4)  ! Detrol La (Tolterodine Tartrate)  Vital  Signs:  Patient profile:   77 year old female Height:      59.7 inches Weight:      131 pounds BMI:     25.94 BSA:     1.56 Pulse rate:   84 / minute Pulse rhythm:   regular BP sitting:   132 / 76  (left arm) Cuff size:   regular  Vitals Entered By: Ok Anis CMA (Jun 07, 2009 10:17 AM)  Physical Exam  Additional Exam:  Constitutional: generally well appearing Psychiatric: alert and oriented times 3 Abdomen: soft, non-tender, non-distended, normal bowel sounds    Impression & Recommendations:  Problem # 1:  fairly large villous-appearing adenoma in the cecum I discussed with her the risks of a repeat colonoscopy with polypectomy including perforation, bleeding, medication issues. She understands that complications could require surgery to fix. She also understands that the other way to potentially resect this is by surgical resection. The other alternative would be to simply leave it alone. She does not want to do that and I agree that at its size I think attempted resection either endoscopically or surgically is the best idea. We will therefore arrange for colonoscopy to be repeated at [facility] Will have a APC available and allow plenty of time to remove this.  Patient Instructions: 1)  You will be scheduled to have a colonoscopy at [facility] with APC available.  Needs 1 hour slot. 2)  The medication list was reviewed and reconciled.  All changed / newly prescribed medications were explained.  A complete medication list was provided to the patient / caregiver.  Appended Document: Orders Update/movi    Clinical Lists Changes  Problems: Added new problem of NONSPECIFIC ABN FINDING RAD & OTH EXAM GI TRACT (ICD-793.4) Medications: Added new medication of MOVIPREP 100 GM  SOLR (PEG-KCL-NACL-NASULF-NA ASC-C) As per prep instructions. - Signed Rx of MOVIPREP 100 GM  SOLR (PEG-KCL-NACL-NASULF-NA ASC-C) As per prep instructions.;  #1 x 0;  Signed;  Entered by: Chales Abrahams CMA  (AAMA);  Authorized by: Rachael Fee MD;  Method used: Electronically to [facility] [phone]*, [address] [place], [place], [place]  [address], Ph: [phone], Fax: [phone] Orders: Added new Test order of ZCOL APC (ZCOL APC) - Signed    Prescriptions: MOVIPREP 100 GM  SOLR (PEG-KCL-NACL-NASULF-NA ASC-C) As per prep instructions.  #1 x 0   Entered by:   Chales Abrahams CMA (AAMA)   Authorized by:   Rachael Fee MD   Signed by:   Chales Abrahams CMA (AAMA) on 06/07/2009   Method used:   Electronically to        [facility] [phone]* (retail)       [address] [place], [place]  [address]       Ph: [phone]       Fax: [phone]   RxID:   [id number]

## 2010-03-04 NOTE — Progress Notes (Signed)
Summary: Triage-Need ASAP appt.  Phone Note From Other Clinic   Caller: Armando Reichert @ [facility]  [phone] Summary of Call: Needs appt ASAP...rectal pain, watery stool Initial call taken by: Karna Christmas,  May 13, 2009 10:51 AM  Follow-up for Phone Call        I left a message for Marion--Please advise pt. of appt. with Mike Gip PAC on 05-14-09 at 10:30am. Please advise her of [email or web address]. Please call back as needed.  Follow-up by: Laureen Ochs LPN,  May 13, 2009 1:15 PM

## 2010-03-04 NOTE — Assessment & Plan Note (Signed)
Summary: RECTAL PAIN, DIARRHEA         (NEW TO GI)        [name]   History of Present Illness Visit Type: Initial Consult Primary GI MD: Sheryn Bison MD FACP FAGA Primary Provider: Roxy Manns, MD Chief Complaint: Patient c/o 5 days rectal pressure and diarrhea. She states that this is very abnormal as she normally has constipation. Patient states that she has seen some occasional dark black stools and small amounts of brbpr. She has had some abdominal discomfort but states that this is infrequent. Patient also c/o GERD and increased belching. She apparently controls this with diet. History of Present Illness:   PLEASANT 77 YO FEMALE NEW TO GI. TODAY. SHE RELATES PRIOR COLONOSCOPY IN [place] 10-15 YEARS AGO WHICH WAS APPARENTLY NORMAL.SHE DOES NOT RECALL MD. NAME. SHE HAS HAD LONG TERM  CONSTIPATION PROBLEMS AND HAS BEEN LAXATIVE DEPENDENT. SHE ALSO HAS SEVERE LUMBAR SCOLIOSIS. SHE IS REFERRED NOW WITH NO BM X 5-6 DAYS, AND SMALL AMTS OF LOOSE STOOL FOR SEVERAL DAYS PRIOR TO THAT. SHE HAS DEVELOPED RECTAL PAIN,PRESSURE AND A CONSTANT SENSE OF URGE FOR BM BUT CANNOT PASS ANYTHING. HAS SEEN A SMALL AMTOF BRB. NO NEW MEDS,PAIN MEDS ETC.SHE HAS ALSO NOTICED INCREASED DIFFICULTY EMPTYING HER BLADDER.  SHE SAW DR. Suszanne Conners EXAM.LAST NIGHT PASSED A LARGE PIECE OF STOOL WITH MUCH EFFORT AND FEELS A LITTLE BETTER TODAY.   GI Review of Systems    Reports abdominal pain and  loss of appetite.     Location of  Abdominal pain: lower abdomen.    Denies acid reflux, belching, bloating, chest pain, dysphagia with liquids, heartburn, nausea, vomiting, vomiting blood, and  weight loss.      Reports change in bowel habits, constipation, rectal bleeding, and  rectal pain.     Denies anal fissure, black tarry stools, diarrhea, diverticulosis, fecal incontinence, heme positive stool, hemorrhoids, jaundice, light color stool, and  liver problems. Preventive Screening-Counseling & Management   Drug Use:  no.      Current Medications (verified): 1)  Lotrel 10-40 Mg Caps (Amlodipine Besy-Benazepril Hcl) .Marland Kitchen.. 1 By Mouth Daily 2)  4 Prong Cane .... To Assist With Ambulation 3)  Potassium Chloride Crys Cr 20 Meq  Tbcr (Potassium Chloride Crys Cr) .... Take One By Mouth Daily 4)  Cane .... Dx 722.6 Degenerative Disk Disease Use Cane For Ambulation As Needed 5)  Aloe Vera 470 Mg Caps (Aloe Vera) .Marland Kitchen.. 1 By Mouth Once Daily (Otc) 6)  Century Mature  Tabs (Multiple Vitamins-Minerals) .Marland Kitchen.. 1 By Mouth Once Daily 7)  Stool Softener 100 Mg Caps (Docusate Sodium) .Marland Kitchen.. 1 By Mouth Once Daily 8)  Ginkgo Biloba 120 Mg Caps (Ginkgo Biloba) .Marland Kitchen.. 1 By Mouth As Needed 9)  Tylenol 325 Mg Tabs (Acetaminophen) .... Otc As Directed. 10)  Vitamin D 2000 Unit Tabs (Cholecalciferol) .... One Tablet Daily 11)  Bactroban 2 % Oint (Mupirocin) .... Apply To Affected Area Two Times A Day As Needed 12)  Support Stockings 5-10 Mm Hg To Knee .... For Swelling and Pain and Venous Insufficiency in Legs To Wear Daily  Allergies (verified): 1)  ! Crestor (Rosuvastatin Calcium) 2)  ! Zocor (Simvastatin) 3)  ! Aspirin (Aspirin) 4)  ! Detrol La (Tolterodine Tartrate)  Past History:  Past Medical History: Allergic rhinitis CHRONIC CONSTIPATION Hyperlipidemia Hypertension Pneumonia, hx of deg disc dz with injections  non-obstructive CAD cath 2008 with med tx     --LAD 40% LCx ok RCA 30% EF  60% Osteoarthritis s/p R hip replacement. renal insufficiency  neurosx--Dr Elenore Paddy ortho-- [facility] [place] for [place] [facility])   Past Surgical History: Appendectomy Carpal tunnel release-left Hemorrhoidectomy Hysterectomy Tonsillectomy Abd surgery for benign fatty tumors, also took bilateral ovaries and gallbladder Back surgery- herniated disk (1999) Cardiac stress test (1999) Dexa- normal (06/2003) Bladder tack  Colonoscopy- polyps, hemorrhoids (12/2005) right hip replacement 09 cataract surgery 2010  Family  History: Father: deceased, MI age 66 Mother: HTN Siblings:  brother alz  Family History of Breast Cancer: Aunt No FH of Colon Cancer: Family History of Esophageal Cancer: Paternal Uncle Family History of Heart Disease: Father Family History of Kidney Disease: Mother  Social History: Marital Status: widowed Children: 5 Occupation: Home nonsmoker Alcohol Use - yes-very rare Illicit Drug Use - no Drug Use:  no  Review of Systems       The patient complains of allergy/sinus, arthritis/joint pain, back pain, change in vision, fatigue, headaches-new, hearing problems, itching, muscle pains/cramps, shortness of breath, swelling of feet/legs, and urine leakage.  The patient denies anemia, anxiety-new, blood in urine, breast changes/lumps, confusion, cough, coughing up blood, depression-new, fainting, fever, heart rhythm changes, menstrual pain, night sweats, nosebleeds, pregnancy symptoms, skin rash, sleeping problems, sore throat, swollen lymph glands, thirst - excessive , urination - excessive , urination changes/pain, vision changes, and voice change.         ROS OTHERWISE AS IN HPI  Vital Signs:  Patient profile:   77 year old female Height:      59.7 inches Weight:      135.50 pounds BSA:     1.58 Pulse rate:   80 / minute Pulse rhythm:   regular BP sitting:   170 / 70  (left arm)  Vitals Entered By: Hortense Ramal CMA Duncan Dull) (May 14, 2009 10:39 AM)  Physical Exam  General:  Well developed, well nourished, no acute distress. Head:  Normocephalic and atraumatic. Eyes:  PERRLA, no icterus. Lungs:  Clear throughout to auscultation. Heart:  Regular rate and rhythm; no murmurs, rubs,  or bruits. Abdomen:  SOFT, NONDISTENDED,BS ACTIVE, NO PALP MASS OR HSM,MILD TENDERNESS ACROSS LOWER ABDOMEN Rectal:  ANUS IRRITATED,TENDER,NO EXTERNAL HEMORRHOIDS-ALOTOF SOFT STOOL IN THE RECTUM -NO HARD IMPACTION,,STOOL HEME POSITIVE Msk:  SEVERE SCOLIOSIS Extremities:  No clubbing, cyanosis,  edema or deformities noted. Neurologic:  Alert and  oriented x4;  grossly normal neurologically. Psych:  Alert and cooperative. Normal mood and affect.   Impression & Recommendations:  Problem # 1:  FECAL IMPACTION (ICD-560.39) Assessment New 77 YO FEMALE WITH CHRONIC CONSTIPATION WITH INABILITY TO HAVE BM X 6 DAYS WITH RECTAL AND LOWER ABDOMINAL PAIN,PRESSURE,AND OVERFLOW STOOLING. SXS IMPROVED SOMEWHAT PAST 24 HOURS WITH SOME EVACUATION. ALSO WITH URINARY SXS--CANOT R/O BLADDER OULET OBSRUCTION SECONDARY TO EXTRINSIC COMPRESSION,STOOL HEME +  LABS AS BELOW' SCHEDULE FOR CT SCAN ABD/PELVIS R/O OTHER PELVIC PATHOLOGY MIRALAX BOWEL PREP ,THEN START MIRALAX 2 DOSES EVERY DAY LIDAMANTLE HC AS NEEDED 3-4 X DAILY FOR RECTAL PAIN. FOLLOW UP WITH DR. PATTERSON IN 2 WEEKS-SHE WILL PROBABLY NEED A COLONOSCOPY ONCE ACUTE PROBLEMS ARE IMPROVED/RESOLVED. Orders: TLB-CMP (Comprehensive Metabolic Pnl) (80053-COMP) TLB-CBC Platelet - w/Differential (85025-CBCD) TLB-Udip w/ Micro (81001-URINE) T-Culture, Urine ([id number]) CT Abdomen/Pelvis with Contrast (CT Abd/Pelvis w/con)  Problem # 2:  ABDOMINAL PAIN-LLQ (ICD-789.04) Assessment: Comment Only SEE ABOVE  Problem # 3:  HYPERTENSION (ICD-401.9) Assessment: Comment Only  Problem # 4:  HYPERLIPIDEMIA (ICD-272.4) Assessment: Comment Only  Problem # 5:  SEVERE SCOLIOSIS-LUMBAR Assessment: Comment Only  Patient Instructions: 1)  Your physician has requested that you have the following labwork done today: Go to basement level please. 2)  We sent prescriptions for Miralax bowel prep and Lidamantle HC rectal cream to your pharmacy, [facility] [address] , [place]. 3)  We scheduled the CT for you at [facility] CT [address] 4)  Franki Cabot explained regarding location of the CT. 5)  After the CT scan, do the bowel prep at home and then use Miralax 17 grams twice daily for constipation. 6)  Copy sent to : Roxy Manns, MD 7)  The medication  list was reviewed and reconciled.  All changed / newly prescribed medications were explained.  A complete medication list was provided to the patient / caregiver. Prescriptions: MIRALAX   POWD (POLYETHYLENE GLYCOL 3350) As per prep  instructions.  #255gm x 0   Entered by:   Lowry Ram NCMA   Authorized by:   Sammuel Cooper PA-c   Signed by:   Lowry Ram NCMA on 05/14/2009   Method used:   Electronically to        [facility] [phone]* (retail)       [address] [place], [place]  [address]       Ph: [phone]       Fax: [phone]   RxID:   [id number] LIDAMANTLE HC 3-0.5 % CREA (LIDOCAINE-HYDROCORTISONE ACE) Use rectally 3-4 times daily as needed for rectal pain.  #30 cc x 0   Entered by:   Lowry Ram NCMA   Authorized by:   Sammuel Cooper PA-c   Signed by:   Lowry Ram NCMA on 05/14/2009   Method used:   Electronically to        [facility] [phone]* (retail)       [address] [place], [place]  [address]       Ph: [phone]       Fax: [phone]   RxID:   [id number]

## 2010-03-04 NOTE — Progress Notes (Signed)
Summary: meds not available  Phone Note From Pharmacy Call back at [phone]   Caller: Jan, pharmacist (female) Call For: Mike Gip  Summary of Call: Lidamantle HC3-0.5%cream is no longer available  Initial call taken by: Vallarie Mare,  May 15, 2009 2:05 PM  Follow-up for Phone Call        Spoke to pharmacist Debarah Crape and he said he would put a tube of Hydrocortisone cream behind the counter for her and he has lidoderm  solution 2% and she can use it rectally 4-5 times daily as needed for rectal pain and pressure. It comes 100 grams in a bottle.  He said her ins would cover it and he said it is not expensive. He will fill the RX with that.   Follow-up by: Joselyn Glassman,  May 15, 2009 2:48 PM  Additional Follow-up for Phone Call Additional follow up Details #1::        Discussed with Josclyn Rosales [facility] PA and she agreed to this. Additional Follow-up by: Joselyn Glassman,  May 15, 2009 2:49 PM

## 2010-03-04 NOTE — Assessment & Plan Note (Signed)
Summary: 6 month follow up/alc   Vital Signs:  Patient profile:   77 year old female Height:      59.7 inches Weight:      136 pounds BMI:     26.93 Temp:     98.6 degrees F oral Pulse rate:   80 / minute Pulse rhythm:   regular BP sitting:   166 / 68  (left arm) Cuff size:   regular  Vitals Entered By: Lewanda Rife LPN (11-12-2009 12:06 PM) CC: six month f/u   History of Present Illness: here for f/u of labile HTN and lipids and renal insuff and mild hyperglycemia   had recent colonosc with polyps-- incompletely retrieved - so will re check in 6 months    lipids need re check - last LDL in 140s  has been trying to eat more vegetables and stay away from fats  eats some eggs 3-4 per week    HTN is labile with hx of orthostasis- making it difficult to control was rushing and having a really stressful day  at home bp runs up and down -- usually 140s systolic to 150s    renal insuff imp with water intake  wt is up 5 lb -- is tryig to drink so much water  6-8 big glasses per day for her renal insufficiency   mildly high sugar trying to watch sugar lately-- but has to bite of something sweet after meals -- trying to subsitute fruit   scoliosis gives her a fit  it affects her swallowing and -- found out that prozac helps that (helps her GI symptoms) no side eff - no depression   lots of post nasal drip -- spits out clear mucous all the time  no heartburn at all   has a walker and uses canes  needs help getting to mailbox cannot keep her balance  weak and cannot stand very long --(this is due to pain - no muscle strength loss)   R shoulder has hurt for a long time-- 3 months and can no longer liftt it  needs evaluatioin of it      Allergies: 1)  ! Crestor (Rosuvastatin Calcium) 2)  ! Zocor (Simvastatin) 3)  ! Aspirin (Aspirin) 4)  ! Detrol La (Tolterodine Tartrate)  Past History:  Past Surgical History: Last updated: 05/14/2009 Appendectomy Carpal  tunnel release-left Hemorrhoidectomy Hysterectomy Tonsillectomy Abd surgery for benign fatty tumors, also took bilateral ovaries and gallbladder Back surgery- herniated disk (1999) Cardiac stress test (1999) Dexa- normal (06/2003) Bladder tack  Colonoscopy- polyps, hemorrhoids (12/2005) right hip replacement 09 cataract surgery 2010  Family History: Last updated: 2009-11-12 Father: deceased, MI age 46 Mother: HTN Siblings:  brother alz - died Family History of Breast Cancer: Aunt No FH of Colon Cancer: Family History of Esophageal Cancer: Paternal Uncle Family History of Heart Disease: Father Family History of Kidney Disease: Mother  Social History: Last updated: 05/14/2009 Marital Status: widowed Children: 5 Occupation: Home nonsmoker Alcohol Use - yes-very rare Illicit Drug Use - no  Risk Factors: Smoking Status: never (06/21/2006)  Past Medical History: Allergic rhinitis CHRONIC CONSTIPATION Hyperlipidemia Hypertension Pneumonia, hx of deg disc dz with injections  non-obstructive CAD cath 2008 with med tx     --LAD 40% LCx ok RCA 30% EF 60% Osteoarthritis s/p R hip replacement. renal insufficiency severe scoliosis - affects her digestion/ GI function and chronic pain   neurosx--Dr Elenore Paddy ortho-- [facility] , [place] for [place] surg)   Family History: Father:  deceased, MI age 36 Mother: HTN Siblings:  brother alz - died Family History of Breast Cancer: Aunt No FH of Colon Cancer: Family History of Esophageal Cancer: Paternal Uncle Family History of Heart Disease: Father Family History of Kidney Disease: Mother  Review of Systems General:  Complains of fatigue; denies fever, loss of appetite, and malaise. Eyes:  Denies blurring and eye irritation. CV:  Denies chest pain or discomfort, lightheadness, and palpitations. Resp:  Denies cough, shortness of breath, and wheezing. GI:  Denies abdominal pain, change in bowel habits, and indigestion. GU:  Denies  dysuria and urinary frequency. MS:  Complains of joint pain and stiffness; denies joint redness, joint swelling, and muscle weakness. Derm:  Denies itching, lesion(s), poor wound healing, and rash. Neuro:  Denies numbness and tingling. Psych:  Denies anxiety and depression. Endo:  Denies cold intolerance, excessive thirst, excessive urination, and heat intolerance. Heme:  Denies abnormal bruising and bleeding.  Physical Exam  General:  somewhat frail appearing elderly female Head:  normocephalic, atraumatic, and no abnormalities observed.   Eyes:  vision grossly intact, pupils equal, pupils round, and pupils reactive to light.   Ears:  R ear normal and L ear normal.   Mouth:  pharynx pink and moist.   Neck:  supple with full rom and no masses or thyromegally, no JVD or carotid bruit  Chest Wall:  No deformities, masses, or tenderness noted. Lungs:  Normal respiratory effort, chest expands symmetrically. Lungs are clear to auscultation, no crackles or wheezes. Heart:  Normal rate and regular rhythm. S1 and S2 normal without gallop, murmur, click, rub or other extra sounds. Abdomen:  Bowel sounds positive,abdomen soft and non-tender without masses, organomegaly or hernias noted. no renal bruits  Msk:  severe thoracolumbar scoliosis with curve to R tender thoracic musculature bilaterally Pulses:  R and L carotid,radial,femoral,dorsalis pedis and posterior tibial pulses are full and equal bilaterally Extremities:  varicosities- compressible and trace edema below knee  Neurologic:  gait normal and DTRs symmetrical and normal.   Skin:  Intact without suspicious lesions or rashes Cervical Nodes:  No lymphadenopathy noted Psych:  mildly anxous today but pleasant   Impression & Recommendations:  Problem # 1:  HYPERGLYCEMIA (ICD-790.29) Assessment New  is working on lower sugar diet - but wt is up  check AIC and adv Orders: Venipuncture (57846) TLB-Lipid Panel (80061-LIPID) TLB-Renal  Function Panel (80069-RENAL) TLB-ALT (SGPT) (84460-ALT) TLB-AST (SGOT) (84450-SGOT) TLB-A1C / Hgb A1C (Glycohemoglobin) (83036-A1C)  Labs Reviewed: Creat: 1.1 (05/14/2009)     Problem # 2:  SHOULDER PAIN, RIGHT (ICD-719.41) Assessment: New with limited mobility ref to ortho Her updated medication list for this problem includes:    Tylenol 325 Mg Tabs (Acetaminophen) ..... Otc as directed.  Orders: Orthopedic Referral (Ortho)  Problem # 3:  RENAL INSUFFICIENCY (ICD-588.9) Assessment: Improved re check this - has imp with better water intake  Problem # 4:  Hx of DEGENERATIVE DISC DISEASE (ICD-722.6) Assessment: Deteriorated with chronic pain and severe scoliosis pt looking into scooter- will get paperwork and f/u for fxn exam  Problem # 5:  HYPERTENSION (ICD-401.9) Assessment: Deteriorated still quite labile unable to change med due to orthostatic sensitivity and syncope will continue to watch Her updated medication list for this problem includes:    Lotrel 10-40 Mg Caps (Amlodipine besy-benazepril hcl) .Marland Kitchen... 1 by mouth daily  Orders: Venipuncture (96295) TLB-Lipid Panel (80061-LIPID) TLB-Renal Function Panel (80069-RENAL) TLB-ALT (SGPT) (84460-ALT) TLB-AST (SGOT) (84450-SGOT) TLB-A1C / Hgb A1C (Glycohemoglobin) (83036-A1C)  Problem # 6:  HYPERLIPIDEMIA (ICD-272.4) Assessment: Unchanged  adv low sat fat diet  eat egg whites not yolks lab today Orders: Venipuncture (44010) TLB-Lipid Panel (80061-LIPID) TLB-Renal Function Panel (80069-RENAL) TLB-ALT (SGPT) (84460-ALT) TLB-AST (SGOT) (84450-SGOT) TLB-A1C / Hgb A1C (Glycohemoglobin) (83036-A1C)  Labs Reviewed: SGOT: 28 (05/14/2009)   SGPT: 19 (05/14/2009)   HDL:53.00 (05/01/2009), 53.90 (12/17/2008)  LDL:118 (10/11/2007), DEL (06/14/2007)  Chol:216 (05/01/2009), 263 (12/17/2008)  Trig:163.0 (05/01/2009), 146.0 (12/17/2008)  Problem # 7:  CONSTIPATION (ICD-564.00) Assessment: Unchanged pt states that a trial of  prozac helped this and is interested in it long term no side eff will call back with the dose she was taking so I can px  Her updated medication list for this problem includes:    Stool Softener 100 Mg Caps (Docusate sodium) .Marland Kitchen... 1 by mouth once daily    Miralax Pack (Polyethylene glycol 3350) ..... Otc as directed.  Complete Medication List: 1)  Lotrel 10-40 Mg Caps (Amlodipine besy-benazepril hcl) .Marland Kitchen.. 1 by mouth daily 2)  4 Prong Cane  .... To assist with ambulation 3)  Potassium Chloride Crys Cr 20 Meq Tbcr (Potassium chloride crys cr) .... Take one by mouth daily 4)  Cane  .... Dx 722.6 degenerative disk disease use cane for ambulation as needed 5)  Aloe Vera 470 Mg Caps (Aloe vera) .Marland Kitchen.. 1 by mouth once daily (otc) 6)  Century Mature Tabs (Multiple vitamins-minerals) .Marland Kitchen.. 1 by mouth once daily 7)  Stool Softener 100 Mg Caps (Docusate sodium) .Marland Kitchen.. 1 by mouth once daily 8)  Ginkgo Biloba 120 Mg Caps (Ginkgo biloba) .Marland Kitchen.. 1 by mouth as needed 9)  Tylenol 325 Mg Tabs (Acetaminophen) .... Otc as directed. 10)  Vitamin D 2000 Unit Tabs (Cholecalciferol) .... One tablet daily 11)  Bactroban 2 % Oint (Mupirocin) .... Apply to affected area two times a day as needed 12)  Support Stockings 5-10 Mm Hg To Knee  .... For swelling and pain and venous insufficiency in legs to wear daily 13)  Miralax Pack (Polyethylene glycol 3350) .... Otc as directed. 14)  Prozac 10 Mg Caps (Fluoxetine hcl)  Patient Instructions: 1)  call me back with the dose of prozac you are taking and I will px it  2)  we will refer you to orthopedics for shoulder pain  3)  please call "[employer]" or whatever scooter company you want and bring paperwork to me -- schedule a 30 minute follow up for functional assessment once you get your paperwork  4)  keep watching your blood pressure  5)  remember to eat protien- like beans/ lean dairy/ nuts/ egg whites/ lean meat -- to keep your strength 6)  watch out for sugar and fat in  diet  7)  labs today  Current Allergies (reviewed today): ! CRESTOR (ROSUVASTATIN CALCIUM) ! ZOCOR (SIMVASTATIN) ! ASPIRIN (ASPIRIN) ! DETROL LA (TOLTERODINE TARTRATE)

## 2010-03-04 NOTE — Progress Notes (Signed)
Summary: Follow up colon   Phone Note Outgoing Call   Call placed by: Ashok Cordia RN,  Jun 03, 2009 12:00 PM Summary of Call: Talked with pt.  Will need repeat colon at hospital with Dr. Christella Hartigan.  See colon report.   Initial call taken by: Ashok Cordia RN,  Jun 03, 2009 12:01 PM  Follow-up for Phone Call        i would prefer to see her in office before scheduling this given higher risk of complications, longer procedure time etc.  Needs next available rov with me. Follow-up by: Rachael Fee MD,  Jun 03, 2009 12:59 PM  Additional Follow-up for Phone Call Additional follow up Details #1::        LM for pt to call.  (can come in on 06/07/09 at 10:15) Ashok Cordia RN  Jun 03, 2009 3:40 PM  Appt sch,  Pt notified. Additional Follow-up by: Ashok Cordia RN,  Jun 03, 2009 4:51 PM

## 2010-03-04 NOTE — Procedures (Signed)
Summary: Colonoscopy  Patient: [name] Note: All result statuses are Final unless otherwise noted.  Tests: (1) Colonoscopy (COL)   COL Colonoscopy           DONE     [facility]     [address] [place], [place]  [address]           COLONOSCOPY PROCEDURE REPORT           PATIENT:  [name], [name]  MR#:  [id number]     BIRTHDATE:  02/21/1920, 88 yrs. old  GENDER:  female     ENDOSCOPIST:  Rachael Fee, MD     REF. BY:  Vania Rea. Jarold Motto, M.D.     PROCEDURE DATE:  06/20/2009     PROCEDURE:  Colonoscopy with snare polypectomy     ASA CLASS:  Class II     INDICATIONS:  villous polyp noted on colonoscopy by Dr. Jarold Motto     last month     MEDICATIONS:   Fentanyl 75 mcg IV, Versed 6 mg IV           DESCRIPTION OF PROCEDURE:   After the risks benefits and     alternatives of the procedure were thoroughly explained, informed     consent was obtained.  Digital rectal exam was performed and     revealed no rectal masses.   The  endoscope was introduced through     the anus and advanced to the cecum, which was identified by both     the appendix and ileocecal valve, without limitations.  The     quality of the prep was good, using MoviPrep.  The instrument was     then slowly withdrawn as the colon was fully examined.     <<PROCEDUREIMAGES>>           FINDINGS:  Two polyps were found, both were removed and sent to     pathology. One was villous, flat, on top of a fold in cecum, 2-3cm     across. This was removed in piecemeal fashion with snare (some     pieces with cautery, some without cautery). Pathology Jar 1. The     other was 7mm, sessile, located in transverse colon, removed with     snare/cautery, pathology jar 2 (see image3, image4, image5,     image7, and image9).  Mild diverticulosis was found (see image1).     Melanosis coli was found throughout the colon (see image2).  This     was otherwise a normal examination of the colon (see image2,  image6, and image11).   Retroflexed views in the rectum revealed     no abnormalities.    The scope was then withdrawn from the patient     and the procedure completed.           COMPLICATIONS:  None     ENDOSCOPIC IMPRESSION:     1) Two polyps found, both removed and sent to pathology.     Largest was previously noted cecal polyp, this required piecemeal     resection to remove.     2) Mild diverticulosis     3) Melanosis throughout the colon     4) Otherwise normal examination           RECOMMENDATIONS:     Await final pathology.  Given piecemeal resection of cecal     polyp, will likely recommend repeat colonoscopy in 6 months.  ______________________________     Rachael Fee, MD           n.     eSIGNED:   Rachael Fee at 06/20/2009 09:31 AM           Mardelle Matte, [id number]  Note: An exclamation mark (!) indicates a result that was not dispersed into the flowsheet. Document Creation Date: 06/20/2009 5:07 PM _______________________________________________________________________  (1) Order result status: Final Collection or observation date-time: 06/20/2009 09:19 Requested date-time:  Receipt date-time:  Reported date-time:  Referring Physician:   Ordering Physician: Rob Bunting [phone]) Specimen Source:  Source: Launa Grill Order Number: [id number] Lab site:

## 2010-03-04 NOTE — Assessment & Plan Note (Signed)
Summary: F3M/AMD   Visit Type:  Follow-up Primary Provider:  Judith Part MD  CC:  no complaints .  History of Present Illness: [name] is seen in followup for hypertension nonobstructive coronary disease and syncope. She also has a history of orthostatic dizziness. She underwent loop recorder implantation because of recurrent syncope. There have been no subsequent events.  She denies chest pain shortness of breath or peripheral edema. There have been no palpitations.    Cath non obstructive , nl lv functionj   Current Problems (verified): 1)  Dysuria  (ICD-788.1) 2)  Vitamin D Deficiency  (ICD-268.9) 3)  Renal Insufficiency  (ICD-588.9) 4)  Pre-operative Cardiovascular Examination  (ICD-V72.81) 5)  Orthostatic Dizziness  (ICD-780.4) 6)  Syncope  (ICD-780.2) 7)  Leg, Lower, Pain  (ICD-719.46) 8)  Hip Pain, Right  (ICD-719.45) 9)  Edema  (ICD-782.3) 10)  Baker's Cyst, Right Knee  (ICD-727.51) 11)  Symptom, Headache  (ICD-784.0) 12)  Fatigue  (ICD-780.79) 13)  Dyspnea/shortness of Breath  (ICD-786.09) 14)  Palpitations  (ICD-785.1) 15)  Hx of Degenerative Disc Disease  (ICD-722.6) 16)  Hx of Incontinence, Urge  (ICD-788.31) 17)  Hx of Breast Mass, Hx of  (ICD-V15.89) 18)  Hx of Fibrocystic Breast Disease  (ICD-610.1) 19)  Pneumonia, Hx of  (ICD-V12.60) 20)  Hypertension  (ICD-401.9) 21)  Hyperlipidemia  (ICD-272.4) 22)  Allergic Rhinitis  (ICD-477.9)  Current Medications (verified): 1)  Lotrel 10-40 Mg Caps (Amlodipine Besy-Benazepril Hcl) .Marland Kitchen.. 1 By Mouth Daily 2)  4 Prong Cane .... To Assist With Ambulation 3)  Potassium Chloride Crys Cr 20 Meq  Tbcr (Potassium Chloride Crys Cr) .... Take One By Mouth Daily 4)  Cane .... Dx 722.6 Degenerative Disk Disease Use Cane For Ambulation As Needed 5)  Aloe Vera 470 Mg Caps (Aloe Vera) .Marland Kitchen.. 1 By Mouth Once Daily (Otc) 6)  Century Mature  Tabs (Multiple Vitamins-Minerals) .Marland Kitchen.. 1 By Mouth Once Daily 7)  Stool Softener 100  Mg Caps (Docusate Sodium) .Marland Kitchen.. 1 By Mouth Once Daily 8)  Ginkgo Biloba 120 Mg Caps (Ginkgo Biloba) .Marland Kitchen.. 1 By Mouth As Needed 9)  Tylenol 325 Mg Tabs (Acetaminophen) .... Otc As Directed. 10)  Vitamin D 2000 Unit Tabs (Cholecalciferol) .... One Tablet Daily 11)  Vitamin D (Ergocalciferol) 50000 Unit Caps (Ergocalciferol) .Marland Kitchen.. 1 By Mouth Once Weekly For 12 Weeks  Allergies (verified): 1)  ! Crestor (Rosuvastatin Calcium) 2)  ! Zocor (Simvastatin) 3)  ! Aspirin (Aspirin) 4)  ! Detrol La (Tolterodine Tartrate)  Past History:  Past Medical History: Last updated: 12/31/2008 Allergic rhinitis Hyperlipidemia Hypertension Pneumonia, hx of deg disc dz with injections  non-obstructive CAD cath 2008 with med tx     --LAD 40% LCx ok RCA 30% EF 60% Osteoarthritis s/p R hip replacement. renal insufficiency  neurosx--Dr Elenore Paddy ortho-- [facility] , [place] for [place] [facility])   Past Surgical History: Last updated: 05/15/2008 Appendectomy Carpal tunnel release Hemorrhoidectomy Hysterectomy Tonsillectomy Abd surgery for benign fatty tumors, also took bilateral ovaries and gallbladder Back surgery- herniated disk (1999) Cardiac stress test (1999) Dexa- normal (06/2003) Bladder tack  Colonoscopy- polyps, hemorrhoids (12/2005) hip replacement 09 cataract surgery 2010  Family History: Last updated: 2006/07/15 Father: deceased, MI age 62 Mother: HTN Siblings:  uncles with throat ca  Social History: Last updated: 07/15/2007 Marital Status: widowed Children: 5 Occupation: Home nonsmoker no alcohol  Risk Factors: Smoking Status: never (2006/07/15)  Vital Signs:  Patient profile:   77 year old female Height:  59.75 inches Weight:      134.25 pounds Pulse rate:   69 / minute Pulse rhythm:   regular BP sitting:   172 / 82  (left arm) Cuff size:   regular  Vitals Entered By: Charlena Cross, RN, BSN (March 18, 2009 9:47 AM)  Physical Exam  General:  The patient was alert  and oriented in no acute distress. HEENT Normal.  Neck veins were flat, carotids were brisk.  Lungs were clear.  Heart sounds were regular without murmurs+s4 Abdomen was soft with active bowel sounds. There is no clubbing cyanosis or edema. Skin Warm and dry     ILR Following MD Sherryl Manges, MD DOI:  10/12/2008 Vendor:  [employer]     Model Number:  [id number]     Serial Number [id number]       Tachy Episodes:  73     Brady Episodes:  0 ILR Next Due 06/02/2009  Tech Comments:  The patient has been asymptomatic since her last visit.  All episodes recorded were noise. ROV 3 months [facility]. Altha Harm, LPN  March 18, 2009 10:02 AM   Impression & Recommendations:  Problem # 1:  SYNCOPE (ICD-780.2) no recurrent events;  loop recorder interrogated Her updated medication list for this problem includes:    Lotrel 10-40 Mg Caps (Amlodipine besy-benazepril hcl) .Marland Kitchen... 1 by mouth daily  Problem # 2:  LOOP RECORDER (ICD-V45.00) Device parameters and data were reviewed and no changes were made  Problem # 3:  HYPERTENSION (ICD-401.9) Poorly controlled. We discussed the importance of systolic hypertension controlled. Apparently previously this had been reviewed with a month of data and his son have quite variable blood pressures. It was elected especially given her symptoms of orthostatic intolerance not to push ahead. In the event that her systolics however remain in the 170 range I think augmented therapy will be indicated  er updated medication list for this problem includes:    Lotrel 10-40 Mg Caps (Amlodipine besy-benazepril hcl) .Marland Kitchen... 1 by mouth daily  Patient Instructions: 1)  Your physician recommends that you schedule a follow-up appointment in: 6 months with Gunnar Fusi

## 2010-03-04 NOTE — Letter (Signed)
Summary: [facility] Instructions  [facility]  [address] [place], [place] [address]   Phone: [phone]  Fax: [phone]       [name]    1920/12/28    MRN: [id number]        Procedure Day /Date:02/04/10 TUE     Arrival Time:8 am     Procedure Time:9 am     Location of Procedure:                    X  [facility] (4th Floor)                        PREPARATION FOR COLONOSCOPY WITH MOVIPREP   Starting 5 days prior to your procedure 01/30/10  do not eat nuts, seeds, popcorn, corn, beans, peas,  salads, or any raw vegetables.  Do not take any fiber supplements (e.g. Metamucil, Citrucel, and Benefiber).  THE DAY BEFORE YOUR PROCEDURE         DATE: 02/03/10  DAY: MON  1.  Drink clear liquids the entire day-NO SOLID FOOD  2.  Do not drink anything colored red or purple.  Avoid juices with pulp.  No orange juice.  3.  Drink at least 64 oz. (8 glasses) of fluid/clear liquids during the day to prevent dehydration and help the prep work efficiently.  CLEAR LIQUIDS INCLUDE: Water Jello Ice Popsicles Tea (sugar ok, no milk/cream) Powdered fruit flavored drinks Coffee (sugar ok, no milk/cream) Gatorade Juice: apple, white grape, white cranberry  Lemonade Clear bullion, consomm, broth Carbonated beverages (any kind) Strained chicken noodle soup Hard Candy                             4.  In the morning, mix first dose of MoviPrep solution:    Empty 1 Pouch A and 1 Pouch B into the disposable container    Add lukewarm drinking water to the top line of the container. Mix to dissolve    Refrigerate (mixed solution should be used within 24 hrs)  5.  Begin drinking the prep at 5:00 p.m. The MoviPrep container is divided by 4 marks.   Every 15 minutes drink the solution down to the next mark (approximately 8 oz) until the full liter is complete.   6.  Follow completed prep with 16 oz of clear liquid of your choice (Nothing red or purple).   Continue to drink clear liquids until bedtime.  7.  Before going to bed, mix second dose of MoviPrep solution:    Empty 1 Pouch A and 1 Pouch B into the disposable container    Add lukewarm drinking water to the top line of the container. Mix to dissolve    Refrigerate  THE DAY OF YOUR PROCEDURE      DATE:02/04/10  DAY: TUE  Beginning at 4 a.m. (5 hours before procedure):         1. Every 15 minutes, drink the solution down to the next mark (approx 8 oz) until the full liter is complete.  2. Follow completed prep with 16 oz. of clear liquid of your choice.    3. You may drink clear liquids until 7 am (2 HOURS BEFORE PROCEDURE).   MEDICATION INSTRUCTIONS  Unless otherwise instructed, you should take regular prescription medications with a small sip of water   as early as possible the morning of your procedure.  OTHER INSTRUCTIONS  You will need a responsible adult at least 75 years of age to accompany you and drive you home.   This person must remain in the waiting room during your procedure.  Wear loose fitting clothing that is easily removed.  Leave jewelry and other valuables at home.  However, you may wish to bring a book to read or  an iPod/MP3 player to listen to music as you wait for your procedure to start.  Remove all body piercing jewelry and leave at home.  Total time from sign-in until discharge is approximately 2-3 hours.  You should go home directly after your procedure and rest.  You can resume normal activities the  day after your procedure.  The day of your procedure you should not:   Drive   Make legal decisions   Operate machinery   Drink alcohol   Return to work  You will receive specific instructions about eating, activities and medications before you leave.    The above instructions have been reviewed and explained to me by   _______________________    I fully understand and can verbalize these instructions  _____________________________ Date _________

## 2010-03-04 NOTE — Letter (Signed)
Summary: [facility] Instructions  [facility]  [address] [place], [place] [address]   Phone: [phone]  Fax: [phone]       [name]    April 30, 1920    MRN: [id number]        Procedure Day /Date:06/20/09     Arrival Time:730 am     Procedure Time:830 am     Location of Procedure:                         X  [facility] ( Outpatient Registration)                        PREPARATION FOR COLONOSCOPY WITH MOVIPREP   Starting 5 days prior to your procedure 06/15/09 do not eat nuts, seeds, popcorn, corn, beans, peas,  salads, or any raw vegetables.  Do not take any fiber supplements (e.g. Metamucil, Citrucel, and Benefiber).  THE DAY BEFORE YOUR PROCEDURE         DATE: 06/19/09  DAY: WED  1.  Drink clear liquids the entire day-NO SOLID FOOD  2.  Do not drink anything colored red or purple.  Avoid juices with pulp.  No orange juice.  3.  Drink at least 64 oz. (8 glasses) of fluid/clear liquids during the day to prevent dehydration and help the prep work efficiently.  CLEAR LIQUIDS INCLUDE: Water Jello Ice Popsicles Tea (sugar ok, no milk/cream) Powdered fruit flavored drinks Coffee (sugar ok, no milk/cream) Gatorade Juice: apple, white grape, white cranberry  Lemonade Clear bullion, consomm, broth Carbonated beverages (any kind) Strained chicken noodle soup Hard Candy                             4.  In the morning, mix first dose of MoviPrep solution:    Empty 1 Pouch A and 1 Pouch B into the disposable container    Add lukewarm drinking water to the top line of the container. Mix to dissolve    Refrigerate (mixed solution should be used within 24 hrs)  5.  Begin drinking the prep at 5:00 p.m. The MoviPrep container is divided by 4 marks.   Every 15 minutes drink the solution down to the next mark (approximately 8 oz) until the full liter is complete.   6.  Follow completed prep with 16 oz of clear liquid of your choice (Nothing  red or purple).  Continue to drink clear liquids until bedtime.  7.  Before going to bed, mix second dose of MoviPrep solution:    Empty 1 Pouch A and 1 Pouch B into the disposable container    Add lukewarm drinking water to the top line of the container. Mix to dissolve    Refrigerate  THE DAY OF YOUR PROCEDURE      DATE: 06/20/09 DAY: THURS  Beginning at 330 a.m. (5 hours before procedure):         1. Every 15 minutes, drink the solution down to the next mark (approx 8 oz) until the full liter is complete.  2. Follow completed prep with 16 oz. of clear liquid of your choice.    3. You may drink clear liquids until 430 am (4 HOURS BEFORE PROCEDURE).   MEDICATION INSTRUCTIONS  Unless otherwise instructed, you should take regular prescription medications with a small sip of water   as early as possible the morning  of your procedure.           OTHER INSTRUCTIONS  You will need a responsible adult at least 75 years of age to accompany you and drive you home.   This person must remain in the waiting room during your procedure.  Wear loose fitting clothing that is easily removed.  Leave jewelry and other valuables at home.  However, you may wish to bring a book to read or  an iPod/MP3 player to listen to music as you wait for your procedure to start.  Remove all body piercing jewelry and leave at home.  Total time from sign-in until discharge is approximately 2-3 hours.  You should go home directly after your procedure and rest.  You can resume normal activities the  day after your procedure.  The day of your procedure you should not:   Drive   Make legal decisions   Operate machinery   Drink alcohol   Return to work  You will receive specific instructions about eating, activities and medications before you leave.    The above instructions have been reviewed and explained to me by   _______________________    I fully understand and can verbalize these  instructions _____________________________ Date _________

## 2010-03-04 NOTE — Procedures (Signed)
Summary: Colonoscopy  Patient: [name] Note: All result statuses are Final unless otherwise noted.  Tests: (1) Colonoscopy (COL)   COL Colonoscopy           DONE (C)     [facility]     [address]     [place], [place]  [address]           COLONOSCOPY PROCEDURE REPORT           PATIENT:  [name], [name]  MR#:  [id number]     BIRTHDATE:  02-Sep-1920, 88 yrs. old  GENDER:  female     ENDOSCOPIST:  Vania Rea. Jarold Motto, MD, [facility]     REF. BY:  Mike Gip, PA-C     PROCEDURE DATE:  05/29/2009     PROCEDURE:  Average-risk screening colonoscopy     G0121     ASA CLASS:  Class II     INDICATIONS:  colorectal cancer screening, average risk,     constipation ABNORMAL CT SCAN.?? REC TAL MASS.     MEDICATIONS:   Fentanyl 25 mcg IV, Versed 5 mg IV           DESCRIPTION OF PROCEDURE:   After the risks benefits and     alternatives of the procedure were thoroughly explained, informed     consent was obtained.  Digital rectal exam was performed and     revealed no abnormalities.   The LB PCF-[id number] endoscope     was introduced through the anus and advanced to the cecum, which     was identified by both the appendix and ileocecal valve, without     limitations.  The quality of the prep was adequate, using     MoviPrep.  The instrument was then slowly withdrawn as the colon     was fully examined.     <<PROCEDUREIMAGES>>           FINDINGS:  A sessile polyp was found. LARGE LINEAR CECAL POLYP IN     BASE OF CECUM.NOT REMOVED.SMALL HYPERPLASTIC NODULES NOTED.     Melanosis coli was found.   Retroflexed views in the rectum     revealed no abnormalities.    The scope was then withdrawn from     the patient and the procedure completed.           COMPLICATIONS:  None     ENDOSCOPIC IMPRESSION:     1) Sessile polyp     2) Polyps, multiple     1.SEVERE CHRONIC CONSTIPATION     2.CECAL VILLOUS ADENOMA.     3.MELANOSIS COLI     RECOMMENDATIONS:     1.QHS MIRALAX  2.POLYP REMOVAL WITH INJECTION,MUCOSAL RESECTION, AND APC     RX.DR.JACOBS.SCHEDULED FOR 1H APPT.     REPEAT EXAM:  No           ______________________________     Vania Rea. Jarold Motto, MD, [facility] Graham           CC:  Judy Pimple, MD           n.     REVISED:  05/29/2009 04:14 PM     eSIGNED:   Vania Rea. Patterson at 05/29/2009 04:14 PM           Mardelle Matte, [id number]  Note: An exclamation mark (!) indicates a result that was not dispersed into the flowsheet. Document Creation Date: 05/29/2009 4:15 PM _______________________________________________________________________  (1) Order result status: Final Collection or observation  date-time: 05/29/2009 15:36 Requested date-time:  Receipt date-time:  Reported date-time:  Referring Physician:   Ordering Physician: Sheryn Bison [phone]) Specimen Source:  Source: Launa Grill Order Number: [id number] Lab site:

## 2010-03-04 NOTE — Letter (Signed)
Summary: Device-Delinquent Check  [facility], Main Office  [address]   [place], [place] [address]   Phone: [phone]  Fax: [phone]     November 01, 2009 MRN: [id number]   [name] [address] [place], [place]  [address]   Dear [name],  According to our records, you have not had your implanted device checked in the recommended period of time.  We are unable to determine appropriate device function without checking your device on a regular basis.  Please call our office to schedule an appointment with Dr Graciela Husbands ,  as soon as possible.  If you are having your device checked by another physician, please call us so that we may update our records.  Thank you, Letta Moynahan, EMT  November 01, 2009 9:36 AM   [facility]

## 2010-03-04 NOTE — Letter (Signed)
Summary: Miralax Instructions (for Impaction)  [facility]  [address] [place], [place] [address]   Phone: [phone]  Fax: [phone]       [name]    1920-07-05    MRN: [id number]     PREPARATION MIRALAX BOWEL PREP (For your impaction)     DATE: 05/14/09 DAY: Tuesday  1   Drink clear liquids the entire day-NO SOLID FOOD  2   Do not drink anything colored red or purple.  Avoid juices with pulp.  No orange juice.  3   Drink at least 64 oz. (8 glasses) of fluid/clear liquids during the day to prevent dehydration and help the prep work efficiently.  CLEAR LIQUIDS INCLUDE: Water Jello Ice Popsicles Tea (sugar ok, no milk/cream) Powdered fruit flavored drinks Coffee (sugar ok, no milk/cream) Gatorade Juice: apple, white grape, white cranberry  Lemonade Clear bullion, consomm, broth Carbonated beverages (any kind) Strained chicken noodle soup Hard Candy  4   Mix the entire bottle of Miralax with 64 oz. of Gatorade/Powerade in the morning and put in the refrigerator to chill.  5   At 3:00 pm take 2 Dulcolax/Bisacodyl tablets.  6   At 4:30 pm take one Reglan/Metoclopramide tablet.  7  Starting at 5:00 pm drink one 8 oz glass of the Miralax mixture every 15-20 minutes until you have finished drinking the entire 64 oz.  You should finish drinking prep around 7:30 or 8:00 pm.  8   If you are nauseated, you may take the 2nd Reglan/Metoclopramide tablet at 6:30 pm.        9    At 8:00 pm take 2 more DULCOLAX/Bisacodyl tablets.            Appended Document: Miralax Instructions (for Impaction) Called into the pharmacy Reglan 5 MG 2 tablets for pt to use for the bowel prep.

## 2010-03-04 NOTE — Letter (Signed)
Summary: Colonoscopy-Changed to Office Visit Letter  [facility]  [address] [place], [place] [address]   Phone: [phone]  Fax: [phone]      October 15, 2009 MRN: [id number]   [name] [address] [place], [place]  [address]   Dear Ms. Baumbach,   According to our records, it is time for you to schedule a Colonoscopy. However, after reviewing your medical record, I feel that an office visit would be most appropriate to more completely evaluate you and determine your need for a repeat procedure.  Please call [phone] (option #2) at your convenience to schedule an office visit. If you have any questions, concerns, or feel that this letter is in error, we would appreciate your call.   Sincerely,  Rachael Fee, M.D.  [facility] [phone]

## 2010-03-04 NOTE — Miscellaneous (Signed)
Summary: [facility] COLON   Clinical Lists Changes  Orders: Added new Test order of Colonoscopy (Colon) - Signed

## 2010-03-04 NOTE — Letter (Signed)
Summary: [facility] Instructions  [facility]  [address] [place], [place] [address]   Phone: [phone]  Fax: [phone]       [name]    10-04-1920    MRN: [id number]        Procedure Day /Date: 05-29-09     Arrival Time: 2:30       Procedure Time: 3:30 PM     Location of Procedure:                       X  [facility] (4th Floor)                       PREPARATION FOR COLONOSCOPY WITH MOVIPREP   Starting 5 days prior to your procedure 05-24-09 do not eat nuts, seeds, popcorn, corn, beans, peas,  salads, or any raw vegetables.  Do not take any fiber supplements (e.g. Metamucil, Citrucel, and Benefiber).  THE DAY BEFORE YOUR PROCEDURE         DATE: 05-28-09 DAY: TUESDAY  1.  Drink clear liquids the entire day-NO SOLID FOOD  2.  Do not drink anything colored red or purple.  Avoid juices with pulp.  No orange juice.  3.  Drink at least 64 oz. (8 glasses) of fluid/clear liquids during the day to prevent dehydration and help the prep work efficiently.  CLEAR LIQUIDS INCLUDE: Water Jello Ice Popsicles Tea (sugar ok, no milk/cream) Powdered fruit flavored drinks Coffee (sugar ok, no milk/cream) Gatorade Juice: apple, white grape, white cranberry  Lemonade Clear bullion, consomm, broth Carbonated beverages (any kind) Strained chicken noodle soup Hard Candy                             4.  In the morning, mix first dose of MoviPrep solution:    Empty 1 Pouch A and 1 Pouch B into the disposable container    Add lukewarm drinking water to the top line of the container. Mix to dissolve    Refrigerate (mixed solution should be used within 24 hrs)  5.  Begin drinking the prep at 5:00 p.m. The MoviPrep container is divided by 4 marks.   Every 15 minutes drink the solution down to the next mark (approximately 8 oz) until the full liter is complete.   6.  Follow completed prep with 16 oz of clear liquid of your choice (Nothing red or  purple).  Continue to drink clear liquids until bedtime.  7.  Before going to bed, mix second dose of MoviPrep solution:    Empty 1 Pouch A and 1 Pouch B into the disposable container    Add lukewarm drinking water to the top line of the container. Mix to dissolve    Refrigerate  THE DAY OF YOUR PROCEDURE      DATE: 05-29-09 DAY: WEDNESDAY  Beginning at 10:30 AM (5 hours before procedure):         1. Every 15 minutes, drink the solution down to the next mark (approx 8 oz) until the full liter is complete.  2. Follow completed prep with 16 oz. of clear liquid of your choice.    3. You may drink clear liquids until 1:30 PM  (2 HOURS BEFORE PROCEDURE).   MEDICATION INSTRUCTIONS  Unless otherwise instructed, you should take regular prescription medications with a small sip of water   as early as possible the morning  of your procedure.        OTHER INSTRUCTIONS  You will need a responsible adult at least 75 years of age to accompany you and drive you home.   This person must remain in the waiting room during your procedure.  Wear loose fitting clothing that is easily removed.  Leave jewelry and other valuables at home.  However, you may wish to bring a book to read or  an iPod/MP3 player to listen to music as you wait for your procedure to start.  Remove all body piercing jewelry and leave at home.  Total time from sign-in until discharge is approximately 2-3 hours.  You should go home directly after your procedure and rest.  You can resume normal activities the  day after your procedure.  The day of your procedure you should not:   Drive   Make legal decisions   Operate machinery   Drink alcohol   Return to work  You will receive specific instructions about eating, activities and medications before you leave.    The above instructions have been reviewed and explained to me by   _______________________    I fully understand and can verbalize these  instructions _____________________________ Date _________

## 2010-03-06 NOTE — Progress Notes (Signed)
Summary: call a nurse   Phone Note Call from Patient   Caller: Patient Call For: Judith Part MD Summary of Call: Triage Record Num: [id number] Operator: Kerby Moors Patient Name: Margret Moat Call Date & Time: 01/19/2010 10:02:43PM Patient Phone: [phone] PCP: Idamae Schuller A. Tower Patient Gender: Female PCP Fax : Patient DOB: [birth year] Practice Name: [facility] Reason for Call: Daughter/Dianne calling about vomiting 8-10xs today and severe stomach pain, onset 12/18 approx 1500. Unable to keep anything down. Vomit it greenish colored. Severe pain from "stomach to belly button", relieved briefly after vomiting but comes back within and is worse. Abd looks tight and is tender to touch. Advised to have pt seen in ER tonight, daughter agrees will take pt to [facility]. Protocol(s) Used: Nausea or Vomiting Recommended Outcome per Protocol: See ED Immediately Reason for Outcome: Abdominal pain, which may be colicky, followed by episodes of vomiting occurring for more than 8 hours; abdominal swelling may or may not occur. Care Advice:  ~ Another adult should drive.  ~ Do not give the patient anything to eat or drink. 12/ Initial call taken by: Melody Comas,  January 20, 2010 8:05 AM  Follow-up for Phone Call        please call to f/u how pt is doing. Follow-up by: Eustaquio Boyden  MD,  January 20, 2010 9:15 AM  Additional Follow-up for Phone Call Additional follow up Details #1::        Spoke with patient's daughter, she states that she was admitted with having a hernia. She is having surgery right now and will be in hospital over night.  Additional Follow-up by: Melody Comas,  January 20, 2010 10:35 AM    Additional Follow-up for Phone Call Additional follow up Details #2::    noted.  thanks.  will route to PCP. Follow-up by: Eustaquio Boyden  MD,  January 20, 2010 11:07 AM

## 2010-03-06 NOTE — Progress Notes (Signed)
Summary: pt having problems after surgery  Phone Note Call from Patient   Caller: Patient Call For: Judith Part MD Summary of Call: Pt is very upset, crying.  She had hernia surgery about 3 months ago and she is having some problems with swelling.  She wants to talk to you.  I advised her that she needs to call her surgeon's office but she says she cant find the number.  I found the number for her, to Dr. Joice Lofts Allen's office, she said she would call them.  Follow-up for Phone Call        thanks -- she needs to talk to her surgeon about that Follow-up by: Judith Part MD,  February 27, 2010 12:35 PM

## 2010-03-07 ENCOUNTER — Encounter: Payer: Self-pay | Admitting: Family Medicine

## 2010-03-07 ENCOUNTER — Ambulatory Visit (INDEPENDENT_AMBULATORY_CARE_PROVIDER_SITE_OTHER): Payer: Self-pay | Admitting: Family Medicine

## 2010-03-07 DIAGNOSIS — IMO0002 Reserved for concepts with insufficient information to code with codable children: Secondary | ICD-10-CM

## 2010-03-07 DIAGNOSIS — R3911 Hesitancy of micturition: Secondary | ICD-10-CM

## 2010-03-07 LAB — CONVERTED CEMR LAB
Ketones, urine, test strip: NEGATIVE
Nitrite: NEGATIVE
RBC / HPF: 0
Specific Gravity, Urine: 1.01
Urine crystals, microscopic: 0 /hpf
Urobilinogen, UA: 0.2

## 2010-03-10 ENCOUNTER — Encounter: Payer: Self-pay | Admitting: Family Medicine

## 2010-03-20 NOTE — Assessment & Plan Note (Signed)
Summary: F/U [facility] D/C 01/24/11/CLE HERNIA,BLOOD CLOT AARP   Vital Signs:  Patient profile:   77 year old female Height:      59.7 inches Weight:      133.75 pounds BMI:     26.48 Temp:     97.7 degrees F oral Pulse rate:   72 / minute Pulse rhythm:   irregular BP sitting:   170 / 76  (left arm) Cuff size:   regular  Vitals Entered By: Lewanda Rife LPN (March 07, 2010 10:46 AM) CC: F/U [facility] 01/23/10 hernia. Pt states urine flow is much slower Comments Pt states has been rushing this AM.   History of Present Illness: here for hospital f/u  was hosp from 12/18 to 12/22 with incarcerated inguinal hernia that caused a small bowel obst  this lead to a complication of post op seroma or lymphocele found on CT afterwards (done for swelling)   still has firmness and swelling at her incision site  using heat on it  then changed to moist heat - at advisement -- really slow at getting better  she talked to Dr Freida Busman yesterday  saw Dr Purnell Shoemaker -- going to be f/u in several weeks   she c/o of urine flow being slower now   ua is normal today  drinking cranberry juice  has trouble getting urne out - has to strain to do that  symptoms are on and off  ? rel to pressure from her surgery  bowels are slow as well   no pain med or nausea med since - ? what she was on -- only took 3 pain pills the first week made her feel awful  ? if she had scop patch with surgery or not  tylenol occas   Allergies: 1)  ! Crestor (Rosuvastatin Calcium) 2)  ! Zocor (Simvastatin) 3)  ! Aspirin (Aspirin) 4)  ! Detrol La (Tolterodine Tartrate)  Past History:  Past Medical History: Last updated: 10-26-09 Allergic rhinitis CHRONIC CONSTIPATION Hyperlipidemia Hypertension Pneumonia, hx of deg disc dz with injections  non-obstructive CAD cath 2008 with med tx     --LAD 40% LCx ok RCA 30% EF 60% Osteoarthritis s/p R hip replacement. renal insufficiency severe scoliosis - affects her digestion/  GI function and chronic pain   neurosx--Dr Elenore Paddy ortho-- [facility] , [place] for hip surg)   Family History: Last updated: October 26, 2009 Father: deceased, MI age 22 Mother: HTN Siblings:  brother alz - died Family History of Breast Cancer: Aunt No FH of Colon Cancer: Family History of Esophageal Cancer: Paternal Uncle Family History of Heart Disease: Father Family History of Kidney Disease: Mother  Social History: Last updated: 05/14/2009 Marital Status: widowed Children: 5 Occupation: Home nonsmoker Alcohol Use - yes-very rare Illicit Drug Use - no  Risk Factors: Smoking Status: never (06/21/2006)  Past Surgical History: Appendectomy Carpal tunnel release-left Hemorrhoidectomy Hysterectomy Tonsillectomy Abd surgery for benign fatty tumors, also took bilateral ovaries and gallbladder Back surgery- herniated disk (1999) Cardiac stress test (1999) Dexa- normal (06/2003) Bladder tack  Colonoscopy- polyps, hemorrhoids (12/2005) right hip replacement 09 cataract surgery 2010 2012 bowel obst from hernia  2012 L inguinal hernia surgery- complicated by large seroma   Review of Systems General:  Denies fatigue, loss of appetite, and malaise. Eyes:  Denies eye irritation. CV:  Denies chest pain or discomfort, lightheadness, palpitations, and shortness of breath with exertion. Resp:  Denies cough and wheezing. GI:  Denies bloody stools, change in bowel habits, and indigestion.  GU:  Complains of urinary hesitancy; denies dysuria, hematuria, and urinary frequency. MS:  Denies cramps. Derm:  Denies itching, lesion(s), and rash. Neuro:  Denies numbness and tingling. Psych:  mood is generally ok . Endo:  Denies cold intolerance, excessive thirst, excessive urination, and heat intolerance. Heme:  Denies abnormal bruising and bleeding.  Physical Exam  General:  somewhat frail appearing elderly female Head:  normocephalic, atraumatic, and no abnormalities observed.   Eyes:   vision grossly intact, pupils equal, pupils round, and pupils reactive to light.   Mouth:  pharynx pink and moist.   Neck:  supple with full rom and no masses or thyromegally, no JVD or carotid bruit  Lungs:  Normal respiratory effort, chest expands symmetrically. Lungs are clear to auscultation, no crackles or wheezes. Heart:  Normal rate and regular rhythm. S1 and S2 normal without gallop, murmur, click, rub or other extra sounds. Abdomen:  see msk exam  soft, normal bowel sounds, no hepatomegaly, and no splenomegaly.   Msk:  L groin  large firm area of induration/ very slt redness surrounding hernia scar(which is healed nicely) soft at top and firm at bottom is tender to touch  no drainage  Pulses:  R and L carotid,radial,femoral,dorsalis pedis and posterior tibial pulses are full and equal bilaterally Extremities:  varicosities- compressible and trace edema below knee  Neurologic:  sensation intact to light touch, gait normal, and DTRs symmetrical and normal.   Skin:  healing L inguinal incision Cervical Nodes:  No lymphadenopathy noted Inguinal Nodes:  No significant adenopathy Psych:  normal affect, talkative and pleasant    Impression & Recommendations:  Problem # 1:  URINARY HESITANCY (JYN-829.56) Assessment New nl ua I suspect this could be from mass effect from large seroma at inguinal hernia site -- unsure ref back to surgeon for eval  ? if will need cath for a while or more aggressive tx of seroma  Orders: UA Dipstick W/ Micro (manual) (21308) Surgical Referral (Surgery)  Problem # 2:  SEROMA COMPLICATING A PROCEDURE NEC (MVH-846.96) Assessment: New see above  reviewed hospital records and studies in detail - for hernia and surgery / complication  area does not appear infected but size and discomfort are bothering pt ? if this is causing urinary hesitancy also ?  recommend f/u with surgeon to re eval  Orders: Surgical Referral (Surgery)  Complete Medication  List: 1)  Lotrel 10-40 Mg Caps (Amlodipine besy-benazepril hcl) .Marland Kitchen.. 1 by mouth daily 2)  4 Prong Cane  .... To assist with ambulation 3)  Potassium Chloride Crys Cr 20 Meq Tbcr (Potassium chloride crys cr) .... Take one by mouth daily 4)  Cane  .... Dx 722.6 degenerative disk disease use cane for ambulation as needed 5)  Aloe Vera 470 Mg Caps (Aloe vera) .Marland Kitchen.. 1 by mouth once daily (otc) 6)  Stool Softener 100 Mg Caps (Docusate sodium) .Marland Kitchen.. 1 by mouth once daily 7)  Ginkgo Biloba 120 Mg Caps (Ginkgo biloba) .Marland Kitchen.. 1 by mouth as needed 8)  Tylenol 325 Mg Tabs (Acetaminophen) .... Otc as directed. 9)  Vitamin D 2000 Unit Tabs (Cholecalciferol) .... One tablet daily 10)  Bactroban 2 % Oint (Mupirocin) .... Apply to affected area two times a day as needed 11)  Miralax Pack (Polyethylene glycol 3350) .... Otc as directed. 12)  Multivitamins Tabs (Multiple vitamin) .... Take 1 tablet by mouth once a day  Patient Instructions: 1)  we will do surgical referral at check out  2)  if  you have total inability to urinate or fever or other symptoms before your surgical appt -please go to [facility]    Orders Added: 1)  UA Dipstick W/ Micro (manual) [81000] 2)  Surgical Referral [Surgery] 3)  Est. Patient Level IV [04540]    Current Allergies (reviewed today): ! CRESTOR (ROSUVASTATIN CALCIUM) ! ZOCOR (SIMVASTATIN) ! ASPIRIN (ASPIRIN) ! DETROL LA (TOLTERODINE TARTRATE)  Laboratory Results   Urine Tests  Date/Time Received: March 07, 2010 10:49 AM  Date/Time Reported: March 07, 2010 10:49 AM   Routine Urinalysis   Color: yellow Appearance: Hazy Glucose: negative   (Normal Range: Negative) Bilirubin: negative   (Normal Range: Negative) Ketone: negative   (Normal Range: Negative) Spec. Gravity: 1.010   (Normal Range: 1.003-1.035) Blood: trace-intact   (Normal Range: Negative) pH: 6.0   (Normal Range: 5.0-8.0) Protein: trace   (Normal Range: Negative) Urobilinogen: 0.2   (Normal Range:  0-1) Nitrite: negative   (Normal Range: Negative) Leukocyte Esterace: negative   (Normal Range: Negative)  Urine Microscopic WBC/HPF: 0 RBC/HPF: 0 Bacteria/HPF: few Mucous/HPF: 0 Epithelial/HPF: 0-1 Crystals/HPF: 0 Casts/LPF: 0 Yeast/HPF: 0 Other: 0

## 2010-03-20 NOTE — Op Note (Signed)
Summary: Operative Report  Operative Report   Imported By: Kassie Mends 03/14/2010 10:39:53  _____________________________________________________________________  External Attachment:    Type:   Image     Comment:   External Document

## 2010-03-20 NOTE — Letter (Signed)
Summary: Discharge Summary  Discharge Summary   Imported By: Kassie Mends 03/14/2010 10:38:58  _____________________________________________________________________  External Attachment:    Type:   Image     Comment:   External Document

## 2010-03-21 ENCOUNTER — Other Ambulatory Visit: Payer: Self-pay | Admitting: Gastroenterology

## 2010-04-01 NOTE — Letter (Signed)
Summary: [facility]   Imported By: Lanelle Bal 03/20/2010 10:53:13  _____________________________________________________________________  External Attachment:    Type:   Image     Comment:   External Document

## 2010-04-14 LAB — URINE CULTURE
Colony Count: NO GROWTH
Culture: NO GROWTH

## 2010-04-14 LAB — CBC
HCT: 35 % — ABNORMAL LOW (ref 36.0–46.0)
HCT: 42.1 % (ref 36.0–46.0)
MCH: 30.4 pg (ref 26.0–34.0)
MCHC: 32.9 g/dL (ref 30.0–36.0)
MCV: 91.5 fL (ref 78.0–100.0)
Platelets: 246 10*3/uL (ref 150–400)
Platelets: 314 10*3/uL (ref 150–400)
RBC: 4.6 MIL/uL (ref 3.87–5.11)
RDW: 13.5 % (ref 11.5–15.5)
WBC: 9.5 10*3/uL (ref 4.0–10.5)

## 2010-04-14 LAB — BASIC METABOLIC PANEL
BUN: 14 mg/dL (ref 6–23)
Calcium: 8.2 mg/dL — ABNORMAL LOW (ref 8.4–10.5)
GFR calc non Af Amer: 46 mL/min — ABNORMAL LOW (ref >60)
Glucose, Bld: 107 mg/dL — ABNORMAL HIGH (ref 70–99)
Potassium: 3.9 mEq/L (ref 3.5–5.1)
Sodium: 135 mEq/L (ref 135–145)

## 2010-04-14 LAB — DIFFERENTIAL
Basophils Absolute: 0 10*3/uL (ref 0.0–0.1)
Basophils Relative: 0 % (ref 0–1)
Lymphocytes Relative: 5 % — ABNORMAL LOW (ref 12–46)
Neutro Abs: 9.9 10*3/uL — ABNORMAL HIGH (ref 1.7–7.7)
Neutrophils Relative %: 90 % — ABNORMAL HIGH (ref 43–77)

## 2010-04-14 LAB — URINALYSIS, ROUTINE W REFLEX MICROSCOPIC
Leukocytes, UA: NEGATIVE
Nitrite: NEGATIVE
Specific Gravity, Urine: 1.014 (ref 1.005–1.030)
Urobilinogen, UA: 1 mg/dL (ref 0.0–1.0)
pH: 7.5 (ref 5.0–8.0)

## 2010-04-14 LAB — POCT I-STAT, CHEM 8
Chloride: 100 mEq/L (ref 96–112)
HCT: 46 % (ref 36.0–46.0)
Hemoglobin: 15.6 g/dL — ABNORMAL HIGH (ref 12.0–15.0)
Potassium: 3.6 mEq/L (ref 3.5–5.1)
Sodium: 136 mEq/L (ref 135–145)

## 2010-04-14 LAB — LIPASE, BLOOD: Lipase: 21 U/L (ref 11–59)

## 2010-04-14 LAB — COMPREHENSIVE METABOLIC PANEL
Albumin: 4.1 g/dL (ref 3.5–5.2)
BUN: 15 mg/dL (ref 6–23)
CO2: 28 mEq/L (ref 19–32)
Chloride: 100 mEq/L (ref 96–112)
Creatinine, Ser: 1.15 mg/dL (ref 0.4–1.2)
GFR calc non Af Amer: 45 mL/min — ABNORMAL LOW (ref >60)
Glucose, Bld: 169 mg/dL — ABNORMAL HIGH (ref 70–99)
Total Bilirubin: 1.1 mg/dL (ref 0.3–1.2)

## 2010-04-14 LAB — URINE MICROSCOPIC-ADD ON

## 2010-04-29 ENCOUNTER — Encounter: Payer: Self-pay | Admitting: Gastroenterology

## 2010-04-30 ENCOUNTER — Ambulatory Visit (AMBULATORY_SURGERY_CENTER): Payer: MEDICARE | Admitting: Gastroenterology

## 2010-04-30 ENCOUNTER — Encounter: Payer: Self-pay | Admitting: Gastroenterology

## 2010-04-30 VITALS — BP 182/70 | HR 70 | Temp 98.3°F | Resp 17

## 2010-04-30 DIAGNOSIS — Z86010 Personal history of colonic polyps: Secondary | ICD-10-CM

## 2010-04-30 DIAGNOSIS — D126 Benign neoplasm of colon, unspecified: Secondary | ICD-10-CM

## 2010-04-30 LAB — HM COLONOSCOPY

## 2010-04-30 NOTE — Progress Notes (Signed)
Patient stating she has a nonfunctioning "monitor" under her skin lt. Upper chest. Pt. Stating they could take it out but she opted not to have it removed. Pt. Stating it is not a defibrillator or pacer. Daughter, Burna Mortimer unsure of what type of monitor it is, stating she had it during last colonoscopy. Old records reviewed no documentation of a monitor found. Hand off report to room nurse.

## 2010-04-30 NOTE — Patient Instructions (Signed)
Please refer to blue/green papers for discharge instructions.  

## 2010-05-01 ENCOUNTER — Telehealth: Payer: Self-pay | Admitting: *Deleted

## 2010-05-01 NOTE — Telephone Encounter (Signed)

## 2010-05-06 ENCOUNTER — Encounter: Payer: Self-pay | Admitting: Gastroenterology

## 2010-05-06 NOTE — Procedures (Signed)
Summary: Colonoscopy  Patient: [name] Note: All result statuses are Final unless otherwise noted.  Tests: (1) Colonoscopy (COL)   COL Colonoscopy           DONE     [facility]     [address]     [place], [place]  [address]          COLONOSCOPY PROCEDURE REPORT          PATIENT:  Sherri, Abbott  MR#:  [id number]     BIRTHDATE:  1920/02/24, 89 yrs. 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     Jarold Motto, then removed in piecemeal fashion by Dr. Christella Hartigan)     MEDICATIONS:   Fentanyl 50 mcg IV, Versed 4 mg IV          DESCRIPTION OF PROCEDURE:   After the risks benefits and     alternatives of the procedure were thoroughly explained, informed     consent was obtained.  Digital rectal exam was performed and     revealed no rectal masses.   The LB PCF-H180AL [id number] endoscope     was introduced through the anus and advanced to the cecum, which     was identified by both the appendix and ileocecal valve, without     limitations.  The quality of the prep was good, using MoviPrep.     The instrument was then slowly withdrawn as the colon was fully     examined.     <<PROCEDUREIMAGES>>     FINDINGS:  The site of previous cecal TVA resection was clearly     located. There was a small amount of residual adenomatous mucosa     that was removed with cold snare but not retrieved. A 5mm sessile     polyp was found in asending colon, removed with cold snare, sent     to pathology (jar 1) (see image2, image3, and image4).  This was     otherwise a normal examination of the colon (see image1 and     image5).   Retroflexed views in the rectum revealed no     abnormalities.    The scope was then withdrawn from the patient     and the procedure completed.     COMPLICATIONS:  None           ENDOSCOPIC IMPRESSION:     1) Small amount of residual polyp at site of previous cecal     polypectomy. This residual mucosa was removed. There was also a     small polyp in ascending colon that was removed and sent to     pathology     2) Otherwise normal examination          RECOMMENDATIONS:     1) You will receive a letter within 1-2 weeks with the results     of your biopsy as well as final recommendations. Please call my     office if you have not received a letter after 3 weeks.          ______________________________     Rachael Fee, MD          cc: Roxy Manns, MD  n.     eSIGNED:   Rachael Fee at 04/30/2010 10:28 AM          Mardelle Matte, [id number]  Note: An exclamation mark (!) indicates a result that was not dispersed into the flowsheet. Document Creation Date: 04/30/2010 10:29 AM _______________________________________________________________________  (1) Order result status: Final Collection or observation date-time: 04/30/2010 10:22 Requested date-time:  Receipt date-time:  Reported date-time:  Referring Physician:   Ordering Physician: Rob Bunting [phone]) Specimen Source:  Source: Launa Grill Order Number: [id number] Lab site:

## 2010-05-10 LAB — URINALYSIS, ROUTINE W REFLEX MICROSCOPIC
Hgb urine dipstick: NEGATIVE
Nitrite: NEGATIVE
Protein, ur: 30 mg/dL — AB
Specific Gravity, Urine: 1.015 (ref 1.005–1.030)
Urobilinogen, UA: 0.2 mg/dL (ref 0.0–1.0)

## 2010-05-10 LAB — POCT I-STAT, CHEM 8
BUN: 18 mg/dL (ref 6–23)
Calcium, Ion: 1.19 mmol/L (ref 1.12–1.32)
Chloride: 100 mEq/L (ref 96–112)
Creatinine, Ser: 1.3 mg/dL — ABNORMAL HIGH (ref 0.4–1.2)
Glucose, Bld: 100 mg/dL — ABNORMAL HIGH (ref 70–99)
HCT: 42 % (ref 36.0–46.0)
Potassium: 4.1 mEq/L (ref 3.5–5.1)

## 2010-05-10 LAB — URINE MICROSCOPIC-ADD ON

## 2010-05-10 LAB — POCT CARDIAC MARKERS
CKMB, poc: <1 ng/mL — ABNORMAL LOW (ref 1.0–8.0)
Troponin i, poc: <0.05 ng/mL (ref 0.00–0.09)

## 2010-05-23 ENCOUNTER — Other Ambulatory Visit: Payer: Self-pay | Admitting: Family Medicine

## 2010-06-17 NOTE — Cardiovascular Report (Signed)
NAMEQUINCEY, [name] NO.:  [id number]   MEDICAL RECORD NO.:  [id number]          PATIENT TYPE:  OIB   LOCATION:  1961                         FACILITY:  [facility]   PHYSICIAN:  Bevelyn Buckles. Bensimhon, MDDATE OF BIRTH:  18-Jul-1920   DATE OF PROCEDURE:  07/08/2006  DATE OF DISCHARGE:                            CARDIAC CATHETERIZATION   PRIMARY CARE PHYSICIAN:  Marne A. Milinda Antis, M.D.   PATIENT IDENTIFICATION:  Ms. Derstine is a delightful 77 year old woman  with no known coronary disease.  She has been experiencing progressive  dyspnea on exertion and chest pressure.  She was seen in clinic, and we  discussed the risks and benefits of catheterization versus stress  testing.  She and her family have opted for cardiac catheterization.  This was performed in the outpatient catheterization lab.   PROCEDURES PERFORMED:  1. Right heart cath.  2. Left heart cath.  3. Left ventriculogram.  4. Selective coronary angiography.   DESCRIPTION OF PROCEDURE:  The risks and benefits of the catheterization  were explained.  Consent was signed and placed on the chart.  A 4-French  arterial sheath was placed in the right femoral artery using a modified  Seldinger technique.  Standard catheters including JL-4, 3-DRC and  angled pigtail were used for the procedure.  All catheter exchanges were  made over wire.  A 7-French venous sheath was placed in the right  femoral vein and standard Swan-Ganz catheter was used for the procedure.  There were no apparent complications.   HEMODYNAMICS:  Right atrial pressure mean of 1, RV pressure 24/0.  PA  pressure 22/6 with a mean of 13.  Pulmonary capillary wedge pressure  mean of 4.  Central aortic pressure 135/55, mean of 86.  LV pressure  139/3 with an EDP of 8.  The Fick cardiac output was 3.2 liters per  minute.  Cardiac index was 1.9 liters per minute per meter squared.  There was no aortic stenosis or mitral stenosis.   CORONARY ANATOMY:   Left main:  Normal.   LAD:  A long vessel coursing to the apex.  It gave off of 2 small  diagonals.  There was a 30% tubular lesion proximally, a 40% focal  lesion at the very takeoff of a very tiny first diagonal, and there was  a 30% tubular lesion in the midsection.  There was a fairly high-grade  stenosis in the ostium of a very tiny first diagonal.   The left circumflex terminated in a small OM-1.  There was a large  branching ramus.  It was free of significant plaquing.   The right coronary artery was a very large dominant vessel.  It gave off  RV branch, a moderate-sized PDA and 2 large posterolaterals.  There was  a 30% lesion on the midsection.   Left ventriculogram done in the RAO position showed an EF of 60% with no  wall motion abnormalities or mitral regurgitation.   On panning down over the abdominal aorta after the left ventriculogram,  there appeared to be a very small distal abdominal aortic aneurysm.   ASSESSMENT:  1.  Mild nonobstructive coronary artery disease.  2. Normal left ventricular function.  3. Normal right-sided pressures.  4. Question of a small very distal abdominal aortic aneurysm.   PLAN//DISCUSSION:  It appears that her dyspnea is not cardiac related.  We will continue with medical therapy and primary prevention efforts.  We can repeat her PFT's.  She does have significant scoliosis and wonder  whether or not she may have a restrictive lung defect from this.      Bevelyn Buckles. Bensimhon, MD  Electronically Signed     DRB/MEDQ  D:  07/08/2006  T:  07/08/2006  Job:  [id number]

## 2010-06-17 NOTE — Assessment & Plan Note (Signed)
[facility] OFFICE NOTE   Sherri, Abbott                     MRN:          [id number]  DATE:07/02/2006                            DOB:          [birth year]    PRIMARY CARE/REFERRING PHYSICIAN:  Dr. Roxy Manns.   REASON FOR CONSULTATION:  Dyspnea and syncope.   HISTORY OF PRESENT ILLNESS:  [name] is a delightful 77 year old  woman without any history of known cardiac problems.  She has never had  a cardiac catheterization.  She did have a preoperative Cardiolite  approximately 5 years ago which she said was negative.  She tells me  that for about the past year, she has been experiencing increasing  dyspnea.  She did undergo pulmonary function tests which were reportedly  normal.  Currently she is able to walk about 20-30 yards before she gets  short of breath and has to sit down.  She also notes some chest pressure  about 2-3 times per month which can happen at any time.  It lasts about  5 minutes.  There is no associated shortness of breath, nausea,  vomiting, or diaphoresis.  No radiation.  She feels that it does get  some better when she gets up and walks around.  She denies any  orthopnea, no PND.  She does have occasional lower extremity edema.  She  also reports occasional palpitations, mostly skipped beats but does have  occasional episodes of fluttering which can last up to 2-3 minutes.  She  feels funny during these episodes but has not had syncope or presyncope  with them.   Over Christmas, she had an episode where she slipped on the floor and  fell, but she remembers clearly slipping.  On Mother's Day, she was  sitting on her step watching her daughter do some yard work.  She got  up, took 3-4 steps, and passed out.  She cut her leg on a bush, and that  required 25 stitches, and the wound is still healing.  She has no memory  of the event.  She denies any preceding chest pain, shortness  of breath,  or palpitations.  She has not had any further syncope but says she does  feel lightheaded occasionally when she stands up.   REVIEW OF SYSTEMS:  Notable for chronic back pain, status post multiple  surgeries for ruptured disks.  She also has constipation, fatigue,  arthritis pains, gastroesophageal reflux, and significant urinary tract  problems.  She also notes easy bleeding with low dose aspirin.  The  remainder of review of systems is negative except for HPI and problem  list.   PROBLEM LIST:  1. Syncope as described above.  2. Palpitations.  3. Hypertension.  4. Hyperlipidemia.  She has been intolerant of all statins, as they      make her feel bad and give her myalgias.  5. She also has osteoporosis and scoliosis.  6. Osteoarthritis.  7. Degenerative disk disease status post several surgeries.   CURRENT MEDICATIONS:  1. Lotrel 10/40.  2. Keflex 500 b.i.d. for her lower  extremity wound.   ALLERGIES/INTOLERANCES:  1. ASPIRIN WHICH CAUSES HEARTBURN AND SKIN BLEEDING.  2. STATINS WHICH CAUSE MYALGIAS AND MAKE HER FEEL BAD.  3. CELEBREX CAUSES HER TO HAVE FATIGUE AND JITTERINESS.   SOCIAL HISTORY:  She is widowed.  She has 5 children.  She is a retired  Futures trader.  No tobacco or alcohol use.   FAMILY HISTORY:  Mother died at 33 due to old age.  Father died at 53  after multiple myocardial infarctions, first one at 84.  She has 6  siblings with no history of premature coronary disease.   PHYSICAL EXAMINATION:  VITAL SIGNS:  She is an elderly woman who looks  younger than her stated age.  She walks around the clinic slowly but no  respiratory difficulty. Blood pressure lying flat was 153/76.  Heart  rate is 61.  On standing up, she did get mildly dizzy.  Blood pressure  dropped to 119/74 with a heart rate of 79.  HEENT:  Normal.  NECK:  Supple.  There is no JVD.  Carotids are 2+ bilaterally without  any bruits.  There is no lymphadenopathy or thyromegaly.   CARDIAC:  She has a regular rate and rhythm with no murmurs, rubs, or  gallops.  LUNGS:  Clear.  No wheezes or rales.  ABDOMEN:  Soft, nontender, nondistended.  No hepatosplenomegaly, no  bruits, no masses, good bowel sounds.  EXTREMITIES:  Warm with no cyanosis or clubbing.  There is trace edema.  She has a bandage on her left lower leg where they took the stitches  out.  I did not undress this.  DP pulses are 1+ bilaterally.  No rash.  NEUROLOGIC:  Alert and oriented x3.  Cranial nerves 2-12 are grossly  intact.  There is no pronator drift.  Finger-to-nose examination is  normal.  On Romberg, she has some mild swaying.  She does have positive  Romberg.  Affect is very pleasant.   EKG shows sinus rhythm, rate of 65 with no ST-T wave abnormalities.  All  the intervals are normal as well as the axis.   ASSESSMENT/PLAN:  1. Dyspnea and chest pressure.  Unsure of the etiology of this.      Certainly could be diastolic dysfunction but also, she is at risk      for underlying coronary artery disease.  We will get a 2 D      echocardiogram.  We have also discussed the risks and benefits of      cardiac catheterization versus stress testing.  Both her and      daughter agree with proceeding with cardiac catheterization, and we      will set this up in the outpatient catheterization laboratory.  2. Syncope.  I suspect this is related to autonomic dysfunction and      orthostatic hypotension.  I have suggested that she use compression      stockings, but she says she is unable to tolerate this.  I have      also suggesting liberalizing her fluid and salt intake and be      careful to avoid dehydration.  We may have to let her blood      pressure run a bit so as not to cause her symptoms.  Certainly, if      this got worse, we could consider midodrine.  Of note, she does     have some mild cerebellar signs and if her balance gets worse, she  could be referred to neurology.  I will leave  this to Dr. Royden Purl      discretion.  3. Palpitations.  I think these are mostly PACs and PVCs, but it seems      like she did have a run of tachycardia the other night.  We will      put a 30 day event monitor on to further evaluate.  As long as her      ventricular function is normal, she should be at low risk for      ventricular dysrhythmias.   DISPOSITION:  Pending the results of the catheterization, if it turns  out that she will need a stent, obviously we will need to use a bare  metal stent given her significant skin bleeding just with low dose  aspirin.     Bevelyn Buckles. Bensimhon, MD  Electronically Signed    DRB/MedQ  DD: 07/02/2006  DT: 07/02/2006  Job #: [id number]   cc:   Marne A. Milinda Antis, MD

## 2010-06-17 NOTE — Discharge Summary (Signed)
NAMEMYKALAH, SAARI NO.:  [id number]   MEDICAL RECORD NO.:  [id number]          PATIENT TYPE:  INP   LOCATION:  5003                         FACILITY:  [facility]   PHYSICIAN:  Elana Alm. Thurston Hole, M.D. DATE OF BIRTH:  Feb 12, 1920   DATE OF ADMISSION:  01/02/2008  DATE OF DISCHARGE:  01/05/2008                               DISCHARGE SUMMARY   ADMITTING DIAGNOSES:  End-stage degenerative joint disease, right hip;  hypertension.   DISCHARGE DIAGNOSES:  End-stage degenerative joint disease, right hip,  status post total hip replacement; hypertension; postoperative blood  loss; anemia; bradycardia; and hyponatremia.   HISTORY OF PRESENT ILLNESS:  The patient is an 77 year old white female  with a history of end-stage DJD of her right hip.  She has failed  conservative care including anti-inflammatories and interarticular hip  injections.  Risks, benefits, and possible complications of a right  total hip replacement were discussed with the patient.  She understands  it and is without question.   Procedures in-house on January 02, 2008, the patient underwent a right  total hip replacement by Dr. Wyline Mood.  Postoperatively, she was admitted  for pain control, DVT prophylaxis, and physical therapy.   HOSPITAL COURSE:  Postop day zero when she arrived at the floor, she was  bradycardic with a pulse of 50.  She is normally in the 70s or 80s.  [facility] was consulted because she was asymptomatic and her  EKG was within normal limits.  They elected to follow her  conservatively.  Postop day #1, her bradycardia had subsided and she was  in normal sinus rhythm at a regular rate as well as regular rhythm.  She  was started on Coumadin and physical therapy was begun.  Her hemoglobin  was 10.0.  Her INR was 1.1.  Her TSH that was drawn came back within  normal limits.  Surgical wound was well approximated.  She was given  Senokot-S.  Postop day #2, hemoglobin is 9.7.  She  was metabolically  stable slightly hyponatremic with a sodium of 131 and given a Dulcolax  suppository weaned off O2.  She ambulated 50 feet with supervision.  Postop day #3, the patient was alert and oriented x3.  Her surgical  wound is well approximated and healing with no excess drainage.  Her  hemoglobin is stable at 10.3.  Her sodium is low at 128.  We will hold  her hydrochlorothiazide.  She is afebrile.  Her blood pressure is  131/61.  Her pulse was 64, respirations were 18.  O2 sats were 98% on  room air.  Her INR is subtherapeutic at 1.1.  She has received 3 mg of  Coumadin on January 02, 2008, 3 mg of Coumadin on January 03, 2008, 4  mg of Coumadin on January 04, 2008, and her INR is dropping and her PT.   DISCHARGE MEDICATIONS:  1. Lovenox 40 mg daily until INR is 2.0 or higher.  2. Coumadin to maintain INR between 2.0 and 3.0.  3. Norco 5/325 one q.4-6 h. p.r.n. pain.  4. Norvasc 10 mg daily.  5. Lotensin  40 mg daily.  6. Potassium 10 mEq daily.  7. Multivitamin 1 tablet daily.  8. Colace 100 mg 1 tablet twice a day while on narcotics.  9. Dulcolax suppository 10 mg as needed for constipation.  10.Hydrochlorothiazide 12.5 mg on hold due to hyponatremia.   DISCHARGE INSTRUCTIONS:  She will follow up with Dr. Wyline Mood on January 15, 2008, for wound check and x-rays.  She needs daily dressing changes  to her right hip.  She is weightbearing as tolerated with posterior  total hip precautions.  She needs physical therapy daily and  occupational therapy for ADLs.  She can shower on January 09, 2008,  getting her wound wet.  The last place she says to wash after she gets  out of the shower is her right hip wound that needs to be washed with  antibacterial soap, patted dry, and covered with a clean dry bandage.      Kirstin Shepperson, P.A.      Robert A. Thurston Hole, M.D.  Electronically Signed    KS/MEDQ  D:  01/05/2008  T:  01/05/2008  Job:  [id number]

## 2010-06-17 NOTE — Assessment & Plan Note (Signed)
[facility] OFFICE NOTE   JAIMI, BELLE                       MRN:          [id number]  DATE:07/26/2006                            DOB:          01-24-1921    PRIMARY CARE PHYSICIAN:  Roxy Manns, M.D.   HISTORY OF PRESENT ILLNESS:  Ms. Palmisano is a delightful 77 year old  woman with a history of orthostatic hypotension, syncope,  hyperlipidemia, osteoporosis, and scoliosis, who returns for a post-  catheterization followup.   We first saw her about 3 weeks ago for syncope and severe dyspnea. On  examination at that time, she was significantly orthostatic with about a  40 point drop in her blood pressure on standing. Her dyspnea was also  concerning for possible underlying diastolic dysfunction or coronary  artery disease. She underwent cardiac catheterization on July 08, 2006,  which showed minimal non-obstructive coronary artery disease with normal  left ventricular function and normal right sided pressures. She also  underwent Holter monitoring, which showed normal sinus rhythm with  occasional PVC's. There were no significant pauses or tachy-arrhythmias.  Echocardiogram showed an ejection fraction of 60% with no significant  valvular abnormalities.   She returns today for routine followup. She says she still feels  significantly short of breath. She denies any chest pain. She is also  suffering from daily headaches, which seem to be getting worse. She  denies any recurrent syncope, though she does feel somewhat dizzy when  standing up. She is scheduled for a pulmonary function test on  Wednesday.   CURRENT MEDICATIONS:  Lotrel 10/40 and Keflex 500 mg four times a day.   PHYSICAL EXAMINATION:  GENERAL:  She is an elderly woman who has  significant kyphosis as well as scoliosis. She walks around the clinic  without any respiratory difficulty.  VITAL SIGNS:  Blood pressure 168/78, heart rate 68,  weight 153.  HEENT:  Normal except for kyphosis.  NECK:  Supple. There is no JVD. Carotids 2+ bilaterally without bruits.  There is no lymphadenopathy or thyromegaly.  CARDIAC:  PMI is non-displaced. Regular rate and rhythm. No murmur, rub,  or gallop.  LUNGS:  Clear.  ABDOMEN:  Soft, nontender, and nondistended. No hepatosplenomegaly, no  bruits, no masses.  EXTREMITIES:  Warm without no clubbing, cyanosis, or edema.  Catheterization site looks good. There is a small bruit over her right  groin.  NEUROLOGIC:  Alert and oriented times three. Cranial nerves 2-12 are  intact. Moves all 4 extremities without difficulty. Affect is pleasant.   ASSESSMENT/PLAN:  1. Dyspnea. This remains of unclear etiology. Her catheterization was      totally normal. I am wondering if she could have some restrictive      lung physiology, due to her scoliosis and kyphosis. She is due to      PFT's. If this does not turn up anything, we can consider      cardiopulmonary exercise testing.  2. Right groin bruit. We will get an ultrasound to evaluate for AV      fistula or pseudoaneurysm post catheterization.  3.  Orthostatic hypotension. I have asked her to keep a detailed blood      pressure log, to see where we are at with her blood pressures. We      have previously suggested pressure stockings and other conservative      measures but she has been reluctant about this.   DISPOSITION:  We will see her back in clinic in 3 months for a routine  followup.     Bevelyn Buckles. Bensimhon, MD  Electronically Signed    DRB/MedQ  DD: 07/26/2006  DT: 07/26/2006  Job #: [id number]   cc:   Marne A. Milinda Antis, MD

## 2010-06-17 NOTE — Consult Note (Signed)
[name], SCHWEBACH NO.:  [id number]   MEDICAL RECORD NO.:  [id number]          PATIENT TYPE:  INP   LOCATION:  5003                         FACILITY:  [facility]   PHYSICIAN:  Jesse Sans. Wall, MD, FACCDATE OF BIRTH:  02/05/1920   DATE OF CONSULTATION:  01/02/2008  DATE OF DISCHARGE:                                 CONSULTATION   PRIMARY CARE PHYSICIAN:  Marne A. Tower, MD   PRIMARY CARDIOLOGIST:  Dr. Sheral Flow from [facility].   CHIEF COMPLAINT:  Bradycardia.   HISTORY OF PRESENT ILLNESS:  [name] is an 77 year old female with  a history of syncope in 2008.  It was felt secondary to orthostatic  hypotension.  She came to the hospital today for a scheduled total hip  replacement.  Postoperatively, her heart rate was in the 30s and they  were concern for symptomatic bradycardia.  Cardiology was asked to  evaluate her.   [name] has no awareness that her heart rate has never been lower  than normal.  She takes her blood pressure occasionally at home but does  not remember any abnormal heart rate.  She has not had recent  palpitations, although she has had tachy palpitations in the past.  In  the last year, she has had no syncope.  Her only symptom related to  presyncope is some orthostatic dizziness that is well controlled by  standing up slowly.  She has never fallen because of this and does not  feel that she is at risk for fall or loss of consciousness.  Because of  her joint problems, her activity has been greatly decreased recently,  but she is otherwise doing well.  Postoperatively, she is not nauseated  or having extreme problems with pain control.  She appears to be resting  comfortably.   PAST MEDICAL HISTORY:  1. Status post cardiac catheterization in June 2008 showing a 30-40%      LAD, first diagonal with high-grade stenosis (small vessel),      circumflex okay, RCA 30%, EF 60%, small distal AAA.  2. History of syncope in May 2008.  3.  History of palpitations with PACs and PVCs seen on telemetry.  4. History of orthostatic hypotension.  5. Osteoarthritis/osteoporosis/scoliosis/degenerative joint disease.  6. Hyperlipidemia.  7. Hypertension.  8. Family history of coronary artery disease.  9. Anemia.   SURGICAL HISTORY:  She is status post cardiac catheterization as well as  hernia repair, back surgery, right total hip replacement today,  tonsillectomy, appendectomy, hysterectomy, and bilateral carpal tunnel.   ALLERGIES:  She is allergic or intolerant to ASPIRIN with easy bruising,  STATINS with aching, and CELEBREX with mental status changes.   CURRENT MEDICATIONS:  1. Ancef 1 g q.12 h.  2. Colace 100 mg b.i.d.  3. DVT Lovenox.  4. Robaxin 500 mg x1.  5. Multivitamin.  6. Coumadin.  7. Norvasc 10 mg daily, Lotensin 40 mg daily, K-Dur 10 mEq daily,      hydrochlorothiazide 12.5 mg daily p.r.n. are on hold.   SOCIAL HISTORY:  She lives in [place] with her daughter.  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  She has no history of alcohol, tobacco, or  drug abuse.  She has decreased activity secondary to orthopedic  problems.   FAMILY HISTORY:  Her mother was 39 when she died of old age.  Her dad  was 69 when he died and he had his first MI at 29.  No siblings have  coronary artery disease.   REVIEW OF SYSTEMS:  She has had fatigue recently and feels that her  ability to exert herself has gone down in the last year.  She has  occasional stress incontinence.  She has significant arthralgias and  joint pains.  She has problems with constipation and occasional reflux  symptoms but no hematemesis, hemoptysis, or melena.  There has been no  recent bright red blood per rectum.  She has had some chest pain in the  past but none recently.  Her dyspnea on exertion is chronic.  It has not  changed recently.  She has rare orthostatic dizziness, which she feels  is well controlled by standing up slowly.  She has not had  true  presyncope or syncope in the last year.  Full 14-point review of systems  is otherwise negative.   PHYSICAL EXAMINATION:  VITAL SIGNS:  Temperature is 96.8, blood pressure  112/60, pulse 56, respiratory rate 16, O2 saturation 96% on 2 L.  GENERAL:  She is a well-developed elderly white female in no acute  distress.  HEENT:  Normal.  NECK:  There is no lymphadenopathy, thyromegaly, or JVD noted, but she  has bilateral carotid bruits, left greater than right.  CV:  He heart is regular in rate and rhythm with an S1-S2 and soft  systolic murmur is noted but no rub or gallop.  Distal pulses are intact  in all 4 extremities.  LUNGS:  Essentially clear to auscultation bilaterally.  SKIN:  No rashes or lesions are noted.  Her incision is dressed and not  draining.  ABDOMEN:  Soft and nontender with active bowel sounds.  No  hepatosplenomegaly by palpation.  EXTREMITIES:  Her right lower extremity is bandaged and dressed in the  area of her incision.  There is no edema noted on either side with TED  hose in place on the left.  MUSCULOSKELETAL:  There is no joint deformity or effusions noted.  NEURO:  She is alert and oriented.  Cranial nerves II through XII  grossly intact.   Chest x-ray performed on December 27, 2007, shows COPD with a calcified  aorta but no acute disease.   EKG shows sinus bradycardia, at rate 54 with no acute ischemic changes.   Laboratory values done on December 27, 2007, show hemoglobin of 13.8,  hematocrit 40.4, WBC 7.9, platelets 354.  Sodium 136, potassium 4.5,  chloride 103, CO2 24, BUN 23, creatinine 1.3, glucose 100, INR 0.9.   IMPRESSION:  1. Bradycardia:  [name] was seen today by Dr. Daleen Squibb.  She has no      symptoms clearly attributable to her bradycardia, and her blood      pressure has been stable so far.  She will be followed closely.      She is at risk for her heart rate dropping overnight, so we will      continue overnight oximetry for  at least 24 hours.  She will be      monitored carefully for oversedation secondary to pain control      medications, but currently this is not  an issue.  There is no      indication for permanent pacemaker at this time unless she develops      symptomatic bradycardia or becomes hypotensive.  She was not on any      rate-lowering medications prior to admission, and she was not given      anything during the surgery that would contribute to this.  Careful      attention will be paid to her blood pressure.  2. Carotid bruits:  Ms. Heffernan has not had carotid Dopplers.  These      can be obtained with further evaluation and treatment depending on      the results.      Theodore Demark, PA-C      Jesse Sans. Daleen Squibb, MD, [facility]  Electronically Signed    RB/MEDQ  D:  01/02/2008  T:  01/03/2008  Job:  [id number]

## 2010-06-17 NOTE — Op Note (Signed)
[name], [name] NO.:  [id number]   MEDICAL RECORD NO.:  [id number]          PATIENT TYPE:  INP   LOCATION:  5003                         FACILITY:  [facility]   PHYSICIAN:  Elana Alm. Thurston Hole, M.D. DATE OF BIRTH:  1920/03/10   DATE OF PROCEDURE:  01/02/2008  DATE OF DISCHARGE:                               OPERATIVE REPORT   PREOPERATIVE DIAGNOSIS:  Right hip degenerative joint disease.   POSTOPERATIVE DIAGNOSIS:  Right hip degenerative joint disease.   PROCEDURES:  Right total hip replacement using DePuy hybrid/cemented  total hip system with acetabulum 50-mm Pinnacle Press-Fit acetabular cup  with 2 locking screws and metal liner.  Femoral component #3 cemented  some at femoral the stem with +8.5 x 36 mm hip ball with +3 cement plug.   SURGEON:  Elana Alm. Thurston Hole, MD.   ASSISTANTJulien Girt, PA   ANESTHESIA:  General.   OPERATIVE TIME:  1 hour and 20 minutes.   ESTIMATED BLOOD LOSS:  300 mL.   COMPLICATIONS:  None.   DESCRIPTION OF PROCEDURE:  [name] was brought to the operating  room on January 02, 2008, placed on the operative table in supine  position.  After an adequate level of general anesthesia was obtained,  she had a Foley catheter placed under sterile conditions.  She received  Ancef 1 g IV preoperatively for prophylaxis.  Her right hip was  examined.  She had flexion to 90 and extension to 0, internal and  external rotation of 10 degrees with approximately 1 inch shortening on  the right compared to the left.  After being placed under general  anesthesia, she was turned to the right lateral decubitus position and  secured on the bed with a Mark frame.  Her right hip and leg was then  prepped using sterile DuraPrep and draped using sterile technique.  Originally, through a 15-cm posterolateral greater trochanteric  incision, initial exposure was made.  The underlying subcutaneous  tissues were incised along the skin  incision.  The iliotibial band and  gluteus maximus fascia was incised longitudinally revealing the  underlying sciatic nerve which was carefully protected.  The short  external rotators of the hip and hip capsule were released off the  femoral neck insertions and tagged.  The hip was then posteriorly  dislocated.  She was found to have grade 3 and 4 DJD with femoral head  collapse.  The femoral neck cut was then made 1.5 to 2 cm above the  lesser trochanter in the appropriate manner of anteversion and  inclination.  Sequential placed retractors were then placed carefully  around the acetabulum.  Degenerative acetabular labrum was resected.  Acetabulum was found to have grade 3 and 4 DJD as well.  Sequential  acetabular reaming was then carried out to a #49 sized in the  appropriate manner of anteversion, abduction, and inclination and a 50-  mm acetabular trial was placed within an excellent fit.  It was then  removed and the actual 50-mm Pinnacle cup was hammered into position in  the appropriate manner of anteversion, abduction, and inclination  with  an excellent fit.  It was further secured in place with two locking  screws; one 25-mm and one 20-mm in the 12 o'clock and 10 o'clock  position.  A metal liner was then placed in the acetabular shell with an  excellent fit.  At this point, then the proximal femur was exposed.  Sequential reaming and broaching was carried out to a #3 size with a #3  broach in place and a +8.5 x 36 mm hip ball trial.  The hip was reduced,  taken through a full range of motion, found to be stable at 80 degrees  of internal rotation in both neutral and 30 degrees of adduction and  also stable in abduction, external rotation, and leg lengths were found  to be equalized.  At this point, this was found to be in excellent size  and fit.  The femoral trial was then removed.  A cement plug was  measured, #3 cement plug was found to be appropriate size and that was   placed down the femoral canal.  The femoral canal was then jet lavaged,  irrigated, and filled with bone cement and then the #3 femoral Summit  stem with a 9.25 centralizer was placed down the femoral canal with an  excellent fit with the excess cement being removed from around the  edges.  The 8.5 x 36 mm hip ball was placed on the femoral neck and  hammered into position with an excellent Morse taper fit.  The hip was  then reduced, taken through a full range of motion, found to be stable  and leg lengths were equal.  At this point, it was felt that all the  components were of excellent size, fit, and stability.  The wound was  further irrigated with saline and then the short external rotators of  the hip and hip capsule were reattached to the femoral neck insertion  through 2 drill holes in the greater trochanter.  The iliotibial band  and gluteus maximus fascia was closed with #1 Ethibond suture.  The  subcutaneous tissues were closed with 0 and 2-0 Vicryl.  Subcuticular  layer closed with 4-0 Monocryl.  Sterile dressings were applied and  abduction pillow.  The patient was then turned supine, checked for leg  lengths; they were equal, rotation equal, and pulses 2+ and symmetric.  She was then awakened, extubated, and taken to the recovery room in  stable condition.  Needle and sponge counts were correct times 2 at the  end of the case.      Robert A. Thurston Hole, M.D.  Electronically Signed     RAW/MEDQ  D:  01/02/2008  T:  01/03/2008  Job:  [id number]

## 2010-06-17 NOTE — Assessment & Plan Note (Signed)
[facility] OFFICE NOTE   [name], [name]                     MRN:          [id number]  DATE:10/28/2006                            DOB:          10/16/1920    INTERVAL HISTORY:  [name] is a delightful 77 year old woman with  a history of orthostatic hypotension, syncope, hyperlipidemia,  osteoporosis, scoliosis, and dyspnea, who returns for followup.   She did undergo cardiac catheterization recently which showed ejection  fraction of 60% with minimal nonobstructive coronary artery disease and  normal right-sided pressures.  She also underwent PFTs which showed only  a very mild obstructive lung defect.  She returns today for routine  followup.  She says that overall, she is doing well.  Her breathing is  better, but she continues to have significant left hip pain which is  getting worse since her fall in June.  She occasionally has difficulty  supporting her weight.  She also has had some mild lower extremity edema  but no orthopnea, PND, or chest pain.   CURRENT MEDICATIONS:  1. Lotrel 10/40.  2. Vitamin E.  3. Vitamin B.   PHYSICAL EXAMINATION:  She is an elderly woman who ambulates around the  office slowly with a cane.  Respirations are unlabored.  She is in no  acute distress.  Blood pressure 168/82, heart rate 78.  Weight is 145.  HEENT:  Normal.  NECK:  Supple.  There is no JVD.  Carotids are 2+ bilaterally.  No  obvious bruits.  There is no lymphadenopathy or thyromegaly.  CARDIAC:  She has distant heart sounds.  PMI is not palpable.  She has a  regular rate and rhythm with an S4.  No murmur.  LUNGS:  Clear.  ABDOMEN:  Soft, nontender, nondistended.  There is no  hepatosplenomegaly.  No bruits.  No masses appreciated.  EXTREMITIES:  Warm with no clubbing or cyanosis.  There is trace to 1+  edema bilaterally.  No rash.  NEURO:  Alert and oriented x3.  Cranial nerves II-XII are intact.   Moves  all four extremities without difficulty.  Affect is pleasant.   ACCESSORY DATA:  She brings a blood pressure log which shows systolic  blood pressures ranging essentially from 130 to 150 with a mean of about  140.  She says occasionally she has had blood pressures in the 115-120  range, and she felt lightheaded at this time.  Diastolic blood pressures  have been in the 60s with heart rates around 60.   ASSESSMENT/PLAN:  1. Dyspnea:  I suspect this is multifactorial.  I do believe she      probably has some component of diastolic dysfunction as well as      some mild airway disease.  We will start her on a low dose      diuretic, HCTZ, 12.5, which will help with her blood pressure and      her volume.  Also start her on Spiriva.  Given her HCTZ, we will      start her on 20 mEq of potassium.  We will check a BMET and a BNP      in one week.  2. Hypertension:  This is elevated.  We will start her on HCTZ.  3. Left hip pain.  I am wondering if she could have an occult      fracture.  She has not had an MRI.  I have asked her to follow up      with someone in orthopedics, potentially Dr. Despina Hick or Dr. Charlann Boxer.   DISPOSITION:  I will see her back in a couple of months for followup.     Sherri Buckles. Bensimhon, MD  Electronically Signed    DRB/MedQ  DD: 10/28/2006  DT: 10/28/2006  Job #: [id number]   cc:   Sherri A. Milinda Antis, MD

## 2010-06-20 NOTE — Op Note (Signed)
   NAMELAKELYN, STRAUS NO.:  [id number]   MEDICAL RECORD NO.:  [id number]                   PATIENT TYPE:  OIB   LOCATION:  2875                                 FACILITY:  [facility]   PHYSICIAN:  Ollen Gross. Vernell Morgans, M.D.              DATE OF BIRTH:  [birth year]   DATE OF PROCEDURE:  11/18/2001  DATE OF DISCHARGE:                                 OPERATIVE REPORT   PREOPERATIVE DIAGNOSIS:  Umbilical hernia.   POSTOPERATIVE DIAGNOSIS:  Umbilical hernia.   PROCEDURE:  Umbilical hernia repair.   SURGEON:  Ollen Gross. Carolynne Edouard, M.D.   ANESTHESIA:  General via LMA.   DESCRIPTION OF PROCEDURE:  After informed consent was obtained, the patient  was brought to the operating room and placed in the supine position on the  operating table. After adequate induction of general anesthesia, the  patient's abdomen was prepped with Betadine and draped in the usual sterile  fashion.  A vertically oriented incision was made and centered around the  umbilicus. This incision was carried down through the skin and subcutaneous  tissue using the Bovie electrocautery.  The hernia sac that was connected to  the underside of the umbilicus was identified and dissected  circumferentially both bluntly with the hemostat and sharply with the  electrocautery until the sac was completely surrounded.  The sac had to be  detached from the underside of the umbilicus which was done sharply with  electrocautery.  The contents of the sac were limited only to fat and this  was excised.  The fascial defect of the umbilical hernia was only about 1.5  cm in diameter. The fascia surrounding this was strong. A finger was able to  be inserted through the defect to palpate the anterior abdominal wall and  locally there were no other weak areas or hernia defects.  The hernia defect  was then closed with multiple interrupted mattress sutures.  The wound was  then irrigated with copious amounts of saline. The  wound was examined and  found to be hemostatic. The subcutaneous tissue was then closed with  multiple interrupted 3-0 Vicryl stitches, and the skin was closed with  staples. Sterile dressings were applied. The patient tolerated the procedure  well.  At the end of the case, all needle, sponge, and instrument counts  correct.  The patient was awakened and taken to the recovery room in stable  condition.                                               Ollen Gross. Vernell Morgans, M.D.    PST/MEDQ  D:  11/18/2001  T:  11/19/2001  Job:  [id number]

## 2010-07-24 ENCOUNTER — Encounter (INDEPENDENT_AMBULATORY_CARE_PROVIDER_SITE_OTHER): Payer: Self-pay | Admitting: General Surgery

## 2010-09-05 ENCOUNTER — Ambulatory Visit: Payer: Self-pay | Admitting: Orthopedic Surgery

## 2010-09-09 ENCOUNTER — Other Ambulatory Visit: Payer: Self-pay | Admitting: Orthopedic Surgery

## 2010-09-09 DIAGNOSIS — M25511 Pain in right shoulder: Secondary | ICD-10-CM

## 2010-09-10 ENCOUNTER — Ambulatory Visit
Admission: RE | Admit: 2010-09-10 | Discharge: 2010-09-10 | Disposition: A | Discharge status: Home / Self Care | Payer: Medicare Other | Source: Ambulatory Visit | Attending: Orthopedic Surgery | Admitting: Orthopedic Surgery

## 2010-09-10 DIAGNOSIS — M25511 Pain in right shoulder: Secondary | ICD-10-CM

## 2010-11-03 ENCOUNTER — Ambulatory Visit (INDEPENDENT_AMBULATORY_CARE_PROVIDER_SITE_OTHER): Payer: Medicare Other | Admitting: Family Medicine

## 2010-11-03 ENCOUNTER — Encounter: Payer: Self-pay | Admitting: Family Medicine

## 2010-11-03 VITALS — BP 160/72 | HR 60 | Temp 98.1°F | Ht 59.0 in | Wt 129.8 lb

## 2010-11-03 DIAGNOSIS — N259 Disorder resulting from impaired renal tubular function, unspecified: Secondary | ICD-10-CM

## 2010-11-03 DIAGNOSIS — N390 Urinary tract infection, site not specified: Secondary | ICD-10-CM

## 2010-11-03 DIAGNOSIS — R350 Frequency of micturition: Secondary | ICD-10-CM

## 2010-11-03 LAB — POCT URINALYSIS DIPSTICK
Nitrite, UA: NEGATIVE
Urobilinogen, UA: 0.2
pH, UA: 6

## 2010-11-03 LAB — POCT UA - MICROSCOPIC ONLY

## 2010-11-03 MED ORDER — CIPROFLOXACIN HCL 250 MG PO TABS: 250.0000 mg | ORAL_TABLET | Freq: Two times a day (BID) | ORAL | Status: AC

## 2010-11-03 NOTE — Assessment & Plan Note (Signed)
With uti Enc good water intake tx uti F/u 1 mo visit and labs

## 2010-11-03 NOTE — Assessment & Plan Note (Signed)
tx with low dose cipro in light of renal insuff  Enc water intake ucx sent  Update if worse Re check/ f/u in 1 mo

## 2010-11-03 NOTE — Patient Instructions (Signed)
Take the cipro 250 mg twice daily for 7 days for urinary infection We will send your urine for culture also  Drink lots and lots of water to flush out urinary system If symptoms worsen or do not improve let me know  Follow up in 1 month for visit and labs

## 2010-11-03 NOTE — Progress Notes (Signed)
Subjective:    Patient ID: [name], female    DOB: [birth year], 77 y.o.   MRN: [id number]  HPI Started getting urine symptoms 1 and 1/2 months ago Bad odor and some frequency of urination - comes and goes  No blood in urine  No fever or nausea  No discomfort over bladder   Her hernia is bothering her on L   No back pain that is new   Patient Active Problem List  Diagnoses  . VITAMIN D DEFICIENCY  . HYPERLIPIDEMIA  . HYPERTENSION  . CORONARY ARTERY DISEASE  . VENOUS INSUFFICIENCY  . ALLERGIC RHINITIS  . CONSTIPATION  . RENAL INSUFFICIENCY  . FIBROCYSTIC BREAST DISEASE  . SHOULDER PAIN, RIGHT  . DEGENERATIVE DISC DISEASE  . BAKER'S CYST, RIGHT KNEE  . SYNCOPE  . ORTHOSTATIC DIZZINESS  . FATIGUE  . EDEMA  . PALPITATIONS  . DYSPNEA/SHORTNESS OF BREATH  . INCONTINENCE, URGE  . HYPERGLYCEMIA  . PNEUMONIA, HX OF  . COLONIC POLYPS, HX OF  . BREAST MASS, HX OF  . SYMPTOM, HEADACHE  . URINARY HESITANCY  . SEROMA COMPLICATING A PROCEDURE NEC  . UTI (lower urinary tract infection)   Past Medical History  Diagnosis Date  . Constipation     chronic  . Asthma   . Heart murmur   . Scoliosis     severe, affects her digestion, GI function- chronic pain  . AR (allergic rhinitis)   . Palpitations   . Hyperlipidemia   . Hypertension   . Pneumonia     history of  . DDD (degenerative disc disease)     with injections  . Osteoarthritis     s/p right hip replacement  . Renal insufficiency   . CAD (coronary artery disease)     non obstructive. Cath 2008 with medical treatment   Past Surgical History  Procedure Date  . Abdominal hysterectomy   . Foot surgery   . Carpal tunnel release     left  . Back surgery 1999    herniated disk  . Colon surgery   . Hernia repair 2012    left inquinal, complicated by large seroma  . Tonsillectomy   . Appendectomy   . Hemorrhoid surgery   . Coronary angioplasty 2008    LAD 40%LCX okRCA 30% EF 60%  . Bladder tack   .  Total hip arthroplasty 2009  . Cataract extraction 2010  . Abdominal surgery     benign fatty tumors, also took bilateral ovaries and gallbladder   History  Substance Use Topics  . Smoking status: Never Smoker   . Smokeless tobacco: Not on file  . Alcohol Use: Yes     Very rare   Family History  Problem Relation Age of Onset  . Hypertension Mother   . Kidney disease Mother   . Heart disease Father     MI  . Alzheimer's disease Brother   . Cancer Paternal Uncle     esophageal   Allergies  Allergen Reactions  . Aspirin   . Celebrex (Celecoxib) Nausea Only  . Detrol (Tolterodine Tartrate)     REACTION: Not effective  . Rosuvastatin     REACTION: Severe leg edema  . Simvastatin     REACTION: Feet burn   Current Outpatient Prescriptions on File Prior to Visit  Medication Sig Dispense Refill  . acetaminophen (TYLENOL) 325 MG tablet Take 650 mg by mouth as needed.        Marland Kitchen amLODipine-benazepril (LOTREL) 10-40  MG per capsule take 1 capsule by mouth daily  90 capsule  1  . Casanthranol-Docusate Sodium 30-100 MG CAPS Take by mouth daily.        . Cholecalciferol (VITAMIN D) 2000 UNITS tablet Take 2,000 Units by mouth daily.        . Cyanocobalamin (VITAMIN B 12 PO) Take by mouth.        . Ginkgo Biloba 120 MG CAPS Take by mouth as needed.        . polyethylene glycol (MIRALAX) packet Take 17 g by mouth as needed.        . potassium chloride SA (K-DUR,KLOR-CON) 20 MEQ tablet Take 10-20 mEq by mouth daily.       . Aloe Vera 470 MG CAPS Take by mouth daily.        Marland Kitchen FLUoxetine (PROZAC) 10 MG capsule Take 10 mg by mouth daily.        . Multiple Vitamins-Minerals (CENTURY MATURE) TABS Take by mouth daily.        . mupirocin (BACTROBAN) 2 % ointment Apply topically 2 (two) times daily as needed.             Review of Systems Review of Systems  Constitutional: Negative for fever, appetite change, fatigue and unexpected weight change.  Eyes: Negative for pain and visual  disturbance.  Respiratory: Negative for cough and shortness of breath.   Cardiovascular: Negative for cp or palpitations    Gastrointestinal: Negative for nausea, diarrhea and constipation.  Genitourinary: pos for urgency and frequency and odor/ neg for hematuria .  Skin: Negative for pallor or rash   Neurological: Negative for weakness, light-headedness, numbness and headaches.  Hematological: Negative for adenopathy. Does not bruise/bleed easily.  Psychiatric/Behavioral: Negative for dysphoric mood. The patient is not nervous/anxious.          Objective:   Physical Exam  Constitutional: She appears well-developed and well-nourished. No distress.  HENT:  Head: Normocephalic and atraumatic.  Mouth/Throat: Oropharynx is clear and moist.  Eyes: Conjunctivae and EOM are normal. Pupils are equal, round, and reactive to light.  Neck: Normal range of motion. Neck supple.  Cardiovascular: Normal rate, regular rhythm, normal heart sounds and intact distal pulses.   Pulmonary/Chest: Effort normal and breath sounds normal. No respiratory distress. She has no wheezes.  Abdominal: Soft. Bowel sounds are normal. She exhibits no distension and no mass.       Very slt suprapubic tenderness  Musculoskeletal:       No cva tenderness  Lymphadenopathy:    She has no cervical adenopathy.  Skin: Skin is warm and dry. No rash noted. No erythema. No pallor.  Psychiatric: She has a normal mood and affect.          Assessment & Plan:

## 2010-11-04 LAB — COMPREHENSIVE METABOLIC PANEL
ALT: 18 U/L (ref 0–35)
CO2: 24 mEq/L (ref 19–32)
Calcium: 9.6 mg/dL (ref 8.4–10.5)
Creatinine, Ser: 1.3 mg/dL — ABNORMAL HIGH (ref 0.4–1.2)
GFR calc non Af Amer: 39 mL/min — ABNORMAL LOW (ref >60)
Glucose, Bld: 100 mg/dL — ABNORMAL HIGH (ref 70–99)
Total Bilirubin: 0.8 mg/dL (ref 0.3–1.2)

## 2010-11-04 LAB — CBC
HCT: 40.4 % (ref 36.0–46.0)
Hemoglobin: 13.8 g/dL (ref 12.0–15.0)
MCHC: 34 g/dL (ref 30.0–36.0)
MCV: 92.1 fL (ref 78.0–100.0)
RBC: 4.39 MIL/uL (ref 3.87–5.11)

## 2010-11-04 LAB — URINE CULTURE: Culture: NO GROWTH

## 2010-11-04 LAB — DIFFERENTIAL
Basophils Absolute: 0 10*3/uL (ref 0.0–0.1)
Eosinophils Absolute: 0 10*3/uL (ref 0.0–0.7)
Lymphocytes Relative: 15 % (ref 12–46)
Lymphs Abs: 1.2 10*3/uL (ref 0.7–4.0)
Neutrophils Relative %: 80 % — ABNORMAL HIGH (ref 43–77)

## 2010-11-04 LAB — URINALYSIS, ROUTINE W REFLEX MICROSCOPIC
Hgb urine dipstick: NEGATIVE
Ketones, ur: 15 mg/dL — AB
Protein, ur: NEGATIVE mg/dL
Urobilinogen, UA: 0.2 mg/dL (ref 0.0–1.0)

## 2010-11-04 LAB — TYPE AND SCREEN
ABO/RH(D): O POS
Antibody Screen: NEGATIVE

## 2010-11-04 LAB — PROTIME-INR
INR: 0.9 (ref 0.00–1.49)
Prothrombin Time: 12.6 seconds (ref 11.6–15.2)

## 2010-11-04 LAB — ABO/RH: ABO/RH(D): O POS

## 2010-11-06 LAB — CBC
HCT: 29.3 % — ABNORMAL LOW (ref 36.0–46.0)
MCHC: 35.1 g/dL (ref 30.0–36.0)
MCV: 90.3 fL (ref 78.0–100.0)
Platelets: 225 10*3/uL (ref 150–400)
Platelets: 247 10*3/uL (ref 150–400)
Platelets: 267 10*3/uL (ref 150–400)
RBC: 3.06 MIL/uL — ABNORMAL LOW (ref 3.87–5.11)
RDW: 14.4 % (ref 11.5–15.5)
RDW: 14.9 % (ref 11.5–15.5)
WBC: 7.4 10*3/uL (ref 4.0–10.5)
WBC: 7.7 10*3/uL (ref 4.0–10.5)
WBC: 9 10*3/uL (ref 4.0–10.5)

## 2010-11-06 LAB — BASIC METABOLIC PANEL
BUN: 15 mg/dL (ref 6–23)
BUN: 16 mg/dL (ref 6–23)
BUN: 19 mg/dL (ref 6–23)
Calcium: 8.2 mg/dL — ABNORMAL LOW (ref 8.4–10.5)
Calcium: 8.3 mg/dL — ABNORMAL LOW (ref 8.4–10.5)
Calcium: 8.4 mg/dL (ref 8.4–10.5)
Creatinine, Ser: 0.95 mg/dL (ref 0.4–1.2)
Creatinine, Ser: 0.97 mg/dL (ref 0.4–1.2)
GFR calc Af Amer: >60 mL/min (ref >60)
GFR calc non Af Amer: 50 mL/min — ABNORMAL LOW (ref >60)
GFR calc non Af Amer: 56 mL/min — ABNORMAL LOW (ref >60)
Glucose, Bld: 110 mg/dL — ABNORMAL HIGH (ref 70–99)
Glucose, Bld: 131 mg/dL — ABNORMAL HIGH (ref 70–99)
Potassium: 4.1 mEq/L (ref 3.5–5.1)
Potassium: 4.6 mEq/L (ref 3.5–5.1)
Sodium: 128 mEq/L — ABNORMAL LOW (ref 135–145)

## 2010-11-06 LAB — TSH: TSH: 0.726 u[IU]/mL (ref 0.350–4.500)

## 2010-11-06 LAB — PROTIME-INR
INR: 1.1 (ref 0.00–1.49)
INR: 1.1 (ref 0.00–1.49)
INR: 1.2 (ref 0.00–1.49)
Prothrombin Time: 14.4 seconds (ref 11.6–15.2)
Prothrombin Time: 15.7 seconds — ABNORMAL HIGH (ref 11.6–15.2)

## 2010-11-15 ENCOUNTER — Telehealth: Payer: Self-pay | Admitting: Family Medicine

## 2010-11-15 DIAGNOSIS — R350 Frequency of micturition: Secondary | ICD-10-CM | POA: Insufficient documentation

## 2010-11-15 NOTE — Telephone Encounter (Signed)
Will do urol ref

## 2010-11-15 NOTE — Telephone Encounter (Signed)
Message copied by Judy Pimple on Sat Nov 15, 2010 10:04 AM ------      Message from: Patience Musca      Created: Thu Nov 13, 2010  5:34 PM      Regarding: urology consult       Please see result note.      ----- Message -----         From: Roxy Manns, MD         Sent: 11/13/2010  12:41 PM           To: Yetta Glassman, LPN            Since the urine cx is clear ... Would not extend the abx       If symptoms are not improved - would consider a ref to urology -- let me know

## 2010-11-20 LAB — POCT I-STAT 3, VENOUS BLOOD GAS (G3P V)
Bicarbonate: 21.3
Bicarbonate: 23
Bicarbonate: 24.3 — ABNORMAL HIGH
O2 Saturation: 73
O2 Saturation: 74
Operator id: 141321
TCO2: 22
TCO2: 24
pCO2, Ven: 35 — ABNORMAL LOW
pCO2, Ven: 36.4 — ABNORMAL LOW
pCO2, Ven: 38.8 — ABNORMAL LOW
pH, Ven: 7.381 — ABNORMAL HIGH
pH, Ven: 7.431 — ABNORMAL HIGH
pO2, Ven: 39
pO2, Ven: 40
pO2, Ven: 50 — ABNORMAL HIGH

## 2010-11-20 LAB — POCT I-STAT 3, ART BLOOD GAS (G3+)
Operator id: 194801
pH, Arterial: 7.434 — ABNORMAL HIGH

## 2010-12-04 ENCOUNTER — Telehealth: Payer: Self-pay | Admitting: Family Medicine

## 2010-12-04 ENCOUNTER — Other Ambulatory Visit (INDEPENDENT_AMBULATORY_CARE_PROVIDER_SITE_OTHER): Payer: Medicare Other

## 2010-12-04 DIAGNOSIS — R609 Edema, unspecified: Secondary | ICD-10-CM

## 2010-12-04 DIAGNOSIS — I10 Essential (primary) hypertension: Secondary | ICD-10-CM

## 2010-12-04 DIAGNOSIS — E559 Vitamin D deficiency, unspecified: Secondary | ICD-10-CM

## 2010-12-04 DIAGNOSIS — E785 Hyperlipidemia, unspecified: Secondary | ICD-10-CM

## 2010-12-04 DIAGNOSIS — N259 Disorder resulting from impaired renal tubular function, unspecified: Secondary | ICD-10-CM

## 2010-12-04 DIAGNOSIS — R7309 Other abnormal glucose: Secondary | ICD-10-CM

## 2010-12-04 LAB — CBC WITH DIFFERENTIAL/PLATELET
Basophils Absolute: 0 10*3/uL (ref 0.0–0.1)
Basophils Relative: 0.5 % (ref 0.0–3.0)
Eosinophils Relative: 1.6 % (ref 0.0–5.0)
Hemoglobin: 13.4 g/dL (ref 12.0–15.0)
Lymphocytes Relative: 15.7 % (ref 12.0–46.0)
Monocytes Relative: 6.1 % (ref 3.0–12.0)
Neutro Abs: 5.5 10*3/uL (ref 1.4–7.7)
RBC: 4.22 Mil/uL (ref 3.87–5.11)
WBC: 7.2 10*3/uL (ref 4.5–10.5)

## 2010-12-04 LAB — LIPID PANEL: Triglycerides: 98 mg/dL (ref 0.0–149.0)

## 2010-12-04 LAB — COMPREHENSIVE METABOLIC PANEL
ALT: 15 U/L (ref 0–35)
BUN: 29 mg/dL — ABNORMAL HIGH (ref 6–23)
CO2: 23 mEq/L (ref 19–32)
Calcium: 9.2 mg/dL (ref 8.4–10.5)
Chloride: 105 mEq/L (ref 96–112)
Creatinine, Ser: 1 mg/dL (ref 0.4–1.2)
GFR: 53.7 mL/min — ABNORMAL LOW (ref >60.00)

## 2010-12-04 LAB — TSH: TSH: 2.24 u[IU]/mL (ref 0.35–5.50)

## 2010-12-04 NOTE — Telephone Encounter (Signed)
Message copied by Judy Pimple on Thu Dec 04, 2010  9:05 AM ------      Message from: Alvina Chou      Created: Wed Dec 03, 2010 12:51 PM      Regarding: labs for Thursday, 12-04-10       Patient is scheduled for CPX labs, please order future labs, Thanks , Camelia Eng

## 2010-12-05 ENCOUNTER — Other Ambulatory Visit: Payer: Self-pay | Admitting: Family Medicine

## 2010-12-09 ENCOUNTER — Encounter: Payer: Self-pay | Admitting: Family Medicine

## 2010-12-10 ENCOUNTER — Encounter: Payer: Self-pay | Admitting: Family Medicine

## 2010-12-10 ENCOUNTER — Ambulatory Visit (INDEPENDENT_AMBULATORY_CARE_PROVIDER_SITE_OTHER): Payer: Medicare Other | Admitting: Family Medicine

## 2010-12-10 VITALS — BP 145/70 | HR 76 | Temp 98.2°F | Ht 59.0 in | Wt 129.2 lb

## 2010-12-10 DIAGNOSIS — R7309 Other abnormal glucose: Secondary | ICD-10-CM

## 2010-12-10 DIAGNOSIS — N259 Disorder resulting from impaired renal tubular function, unspecified: Secondary | ICD-10-CM

## 2010-12-10 DIAGNOSIS — E559 Vitamin D deficiency, unspecified: Secondary | ICD-10-CM

## 2010-12-10 DIAGNOSIS — I10 Essential (primary) hypertension: Secondary | ICD-10-CM

## 2010-12-10 DIAGNOSIS — E785 Hyperlipidemia, unspecified: Secondary | ICD-10-CM

## 2010-12-10 MED ORDER — POTASSIUM CHLORIDE CRYS ER 20 MEQ PO TBCR: EXTENDED_RELEASE_TABLET | ORAL | Status: DC

## 2010-12-10 NOTE — Assessment & Plan Note (Signed)
Lipids are up- rev foods to watch in diet  Pt is motivated -even at 89 to be healthy Will continue to follow Disc goals for lipids and reasons to control them Rev labs with pt Rev low sat fat diet in detail

## 2010-12-10 NOTE — Assessment & Plan Note (Signed)
Good cr of 1.0 - even after uti Bun up - enc better water intake Will continue to follow  F/u 6 mo or earlier if needed Is f/u with urol soon

## 2010-12-10 NOTE — Patient Instructions (Addendum)
Avoid red meat/ fried foods/ egg yolks/ fatty breakfast meats/ butter, cheese and high fat dairy/ and shellfish   Start checking blood pressure at home when you are relaxed - and let me know if it routinely runs over 140 on top or 90 on the bottom Labs are ok  Follow up with the urologist as planned Stay as active as you can  Follow up with me in about 6 months unless needed earlier

## 2010-12-10 NOTE — Progress Notes (Signed)
Subjective:    Patient ID: [name], female    DOB: 07/21/1920, 77 y.o.   MRN: [id number]  HPI Here for f/u of HTN and renal insuff and vit D def and hyperglycemia   Took cipro for last uti- not total imp in symptoms- though neg cx Was ref to urology appt is upcoming 23rd nov  At times still has urinary symtoms - worse with coffee -- may need to stop that Stays away from other acidic beverages  Has to have another colonosc   bp is 150/72 -- at first check  Was rushing to get here No cp or palp or edema or ha   Wt is stable with bmi of 26 Renal insuff   Chemistry      Component Value Date/Time   NA 137 12/04/2010 0940   K 4.0 12/04/2010 0940   CL 105 12/04/2010 0940   CO2 23 12/04/2010 0940   BUN 29* 12/04/2010 0940   CREATININE 1.0 12/04/2010 0940      Component Value Date/Time   CALCIUM 9.2 12/04/2010 0940   ALKPHOS 81 12/04/2010 0940   AST 24 12/04/2010 0940   ALT 15 12/04/2010 0940   BILITOT 0.7 12/04/2010 0940     good cr 1.0 Water intake - is trying to do it -- it makes her urinate more often --  So is an inconvenience   Hyperglycemia well controlled with diet a1c ov 5.7 and sugar 104 fasting   Chol went up Lab Results  Component Value Date   CHOL 210* 12/04/2010   CHOL 208* 10/15/2009   CHOL 216* 05/01/2009   Lab Results  Component Value Date   HDL 60.20 12/04/2010   HDL 53.66 10/15/2009   HDL 44.03 05/01/2009   Lab Results  Component Value Date   LDLCALC 118* 10/11/2007   Lab Results  Component Value Date   TRIG 98.0 12/04/2010   TRIG 125.0 10/15/2009   TRIG 163.0* 05/01/2009   Lab Results  Component Value Date   CHOLHDL 3 12/04/2010   CHOLHDL 4 10/15/2009   CHOLHDL 4 05/01/2009   Lab Results  Component Value Date   LDLDIRECT 143.9 12/04/2010   LDLDIRECT 136.5 10/15/2009   LDLDIRECT 149.9 05/01/2009   does not eat a lot of fatty foods -- but loves sweets- and that often is high fat   Vit D level is 39 at current dose  Patient Active Problem List    Diagnoses  . VITAMIN D DEFICIENCY  . HYPERLIPIDEMIA  . HYPERTENSION  . CORONARY ARTERY DISEASE  . VENOUS INSUFFICIENCY  . ALLERGIC RHINITIS  . CONSTIPATION  . RENAL INSUFFICIENCY  . FIBROCYSTIC BREAST DISEASE  . SHOULDER PAIN, RIGHT  . DEGENERATIVE DISC DISEASE  . BAKER'S CYST, RIGHT KNEE  . SYNCOPE  . ORTHOSTATIC DIZZINESS  . FATIGUE  . EDEMA  . PALPITATIONS  . DYSPNEA/SHORTNESS OF BREATH  . INCONTINENCE, URGE  . HYPERGLYCEMIA  . PNEUMONIA, HX OF  . COLONIC POLYPS, HX OF  . BREAST MASS, HX OF  . SYMPTOM, HEADACHE  . URINARY HESITANCY  . SEROMA COMPLICATING A PROCEDURE NEC  . UTI (lower urinary tract infection)  . Urinary frequency   Past Medical History  Diagnosis Date  . Constipation     chronic  . Asthma   . Heart murmur   . Scoliosis     severe, affects her digestion, GI function- chronic pain  . AR (allergic rhinitis)   . Palpitations   . Hyperlipidemia   .  Hypertension   . Pneumonia     history of  . DDD (degenerative disc disease)     with injections  . Osteoarthritis     s/p right hip replacement  . Renal insufficiency   . CAD (coronary artery disease)     non obstructive. Cath 2008 with medical treatment   Past Surgical History  Procedure Date  . Abdominal hysterectomy   . Foot surgery   . Carpal tunnel release     left  . Back surgery 1999    herniated disk  . Colon surgery   . Hernia repair 2012    left inquinal, complicated by large seroma  . Tonsillectomy   . Appendectomy   . Hemorrhoid surgery   . Coronary angioplasty 2008    LAD 40%LCX okRCA 30% EF 60%  . Bladder tack   . Total hip arthroplasty 2009  . Cataract extraction 2010  . Abdominal surgery     benign fatty tumors, also took bilateral ovaries and gallbladder   History  Substance Use Topics  . Smoking status: Never Smoker   . Smokeless tobacco: Not on file  . Alcohol Use: Yes     Very rare   Family History  Problem Relation Age of Onset  . Hypertension Mother    . Kidney disease Mother   . Heart disease Father     MI  . Alzheimer's disease Brother   . Cancer Paternal Uncle     esophageal   Allergies  Allergen Reactions  . Aspirin   . Celebrex (Celecoxib) Nausea Only  . Detrol (Tolterodine Tartrate)     REACTION: Not effective  . Rosuvastatin     REACTION: Severe leg edema  . Simvastatin     REACTION: Feet burn   Current Outpatient Prescriptions on File Prior to Visit  Medication Sig Dispense Refill  . acetaminophen (TYLENOL) 325 MG tablet Take 650 mg by mouth as needed.        . Aloe Vera 470 MG CAPS Take by mouth daily.        Marland Kitchen amLODipine-benazepril (LOTREL) 10-40 MG per capsule take 1 capsule by mouth daily  90 capsule  1  . Casanthranol-Docusate Sodium 30-100 MG CAPS Take by mouth daily.        . Cholecalciferol (VITAMIN D) 2000 UNITS tablet Take 2,000 Units by mouth daily.        . Cyanocobalamin (VITAMIN B 12 PO) Take by mouth.        . Ginkgo Biloba 120 MG CAPS Take by mouth as needed.        Marland Kitchen FLUoxetine (PROZAC) 10 MG capsule Take 10 mg by mouth daily.        . Multiple Vitamins-Minerals (CENTURY MATURE) TABS Take by mouth daily.        . mupirocin (BACTROBAN) 2 % ointment Apply topically 2 (two) times daily as needed.        . polyethylene glycol (MIRALAX) packet Take 17 g by mouth as needed.              Review of Systems Review of Systems  Constitutional: Negative for fever, appetite change, fatigue and unexpected weight change.  Eyes: Negative for pain and visual disturbance.  Respiratory: Negative for cough and shortness of breath.   Cardiovascular: Negative for cp or palpitations    Gastrointestinal: Negative for nausea, diarrhea and constipation.  Genitourinary: Negative for urgency and frequency.  Skin: Negative for pallor or rash   Neurological: Negative for weakness,  light-headedness, numbness and headaches.  Hematological: Negative for adenopathy. Does not bruise/bleed easily.  Psychiatric/Behavioral:  Negative for dysphoric mood. The patient is not nervous/anxious.          Objective:   Physical Exam  Constitutional: She appears well-developed and well-nourished. No distress.  HENT:  Head: Atraumatic.  Mouth/Throat: Oropharynx is clear and moist. No oropharyngeal exudate.  Eyes: Conjunctivae and EOM are normal. Pupils are equal, round, and reactive to light. No scleral icterus.  Neck: Normal range of motion. Neck supple. No JVD present. Carotid bruit is not present. No thyromegaly present.  Cardiovascular: Normal rate, regular rhythm, normal heart sounds and intact distal pulses.  Exam reveals no gallop.   Pulmonary/Chest: Effort normal and breath sounds normal. No respiratory distress. She has no wheezes.  Abdominal: Soft. Bowel sounds are normal. She exhibits no distension and no mass. There is no tenderness.  Musculoskeletal: She exhibits no edema and no tenderness.  Lymphadenopathy:    She has no cervical adenopathy.  Neurological: She is alert. She has normal reflexes. No cranial nerve deficit. Coordination normal.  Skin: Skin is warm and dry. No rash noted. No erythema. No pallor.  Psychiatric: She has a normal mood and affect.          Assessment & Plan:

## 2010-12-10 NOTE — Assessment & Plan Note (Signed)
Level is ok with current suppl at 1 Will continue this

## 2010-12-10 NOTE — Assessment & Plan Note (Signed)
Overall controlled with diet despite love of sweets (pt controls portions)  Rev a1c of 5.7 Continue to follow  F/u 6 mo

## 2011-02-16 ENCOUNTER — Encounter (INDEPENDENT_AMBULATORY_CARE_PROVIDER_SITE_OTHER): Payer: Self-pay | Admitting: General Surgery

## 2011-02-16 ENCOUNTER — Ambulatory Visit (INDEPENDENT_AMBULATORY_CARE_PROVIDER_SITE_OTHER): Payer: Medicare Other | Admitting: General Surgery

## 2011-02-16 ENCOUNTER — Other Ambulatory Visit (INDEPENDENT_AMBULATORY_CARE_PROVIDER_SITE_OTHER): Payer: Self-pay | Admitting: General Surgery

## 2011-02-16 VITALS — BP 140/86 | HR 68 | Temp 98.0°F | Resp 18 | Ht <= 58 in | Wt 130.0 lb

## 2011-02-16 DIAGNOSIS — R1031 Right lower quadrant pain: Secondary | ICD-10-CM | POA: Insufficient documentation

## 2011-02-16 DIAGNOSIS — K4091 Unilateral inguinal hernia, without obstruction or gangrene, recurrent: Secondary | ICD-10-CM

## 2011-02-16 NOTE — Progress Notes (Signed)
Patient ID: [name], female   DOB: [birth year], 78 y.o.   MRN: [id number]  Chief Complaint  Patient presents with  . Follow-up    reck hematoma     HPI [name] is a 78 y.o. female.   HPI  She is here for long-term followup of left groin pain and hematoma. She underwent an emergency repair of an incarcerated left hernia January 20, 2010. This was complicated by a postoperative hematoma which is improved but she continues to have intermittent pains in the left groin. She presents for evaluation of this. She does have  some difficulty with her bowel movements.  Past Medical History  Diagnosis Date  . Constipation     chronic  . Asthma   . Heart murmur   . Scoliosis     severe, affects her digestion, GI function- chronic pain  . AR (allergic rhinitis)   . Palpitations   . Hyperlipidemia   . Hypertension   . Pneumonia     history of  . DDD (degenerative disc disease)     with injections  . Osteoarthritis     s/p right hip replacement  . Renal insufficiency   . CAD (coronary artery disease)     non obstructive. Cath 2008 with medical treatment    Past Surgical History  Procedure Date  . Abdominal hysterectomy   . Foot surgery   . Carpal tunnel release     left  . Back surgery 1999    herniated disk  . Colon surgery   . Hernia repair 2012    left inquinal, complicated by large seroma  . Tonsillectomy   . Appendectomy   . Hemorrhoid surgery   . Coronary angioplasty 2008    LAD 40%LCX okRCA 30% EF 60%  . Bladder tack   . Total hip arthroplasty 2009  . Cataract extraction 2010  . Abdominal surgery     benign fatty tumors, also took bilateral ovaries and gallbladder    Family History  Problem Relation Age of Onset  . Hypertension Mother   . Kidney disease Mother   . Heart disease Father     MI  . Alzheimer's disease Brother   . Cancer Paternal Uncle     esophageal    Social History History  Substance Use Topics  . Smoking status: Never  Smoker   . Smokeless tobacco: Not on file  . Alcohol Use: Yes     Very rare    Allergies  Allergen Reactions  . Aspirin   . Celebrex (Celecoxib) Nausea Only  . Detrol (Tolterodine Tartrate)     REACTION: Not effective  . Rosuvastatin     REACTION: Severe leg edema  . Simvastatin     REACTION: Feet burn    Current Outpatient Prescriptions  Medication Sig Dispense Refill  . acetaminophen (TYLENOL) 325 MG tablet Take 650 mg by mouth as needed.        . Aloe Vera 470 MG CAPS Take by mouth daily.        Marland Kitchen amLODipine-benazepril (LOTREL) 10-40 MG per capsule take 1 capsule by mouth daily  90 capsule  1  . Bisacodyl (LAXATIVE PO) Take by mouth. Smooth Move  One packet  By mouth at bedtime for laxative.       Jennette Banker Sodium 30-100 MG CAPS Take by mouth daily.        . Cholecalciferol (VITAMIN D) 2000 UNITS tablet Take 2,000 Units by mouth daily.        Marland Kitchen  Cyanocobalamin (VITAMIN B 12 PO) Take by mouth.        . Ginkgo Biloba 120 MG CAPS Take by mouth as needed.        Marland Kitchen MAGNESIUM ASPARTATE PO Take 1 tablet by mouth daily.        . Multiple Vitamins-Minerals (CENTURY MATURE) TABS Take by mouth daily.        . Oxybutynin (GELNIQUE) 3 (28) % (MG/ACT) GEL Place onto the skin daily.      . polyethylene glycol (MIRALAX) packet Take 17 g by mouth as needed.        . potassium chloride SA (K-DUR,KLOR-CON) 20 MEQ tablet 1/2 pill by mouth once daily  45 tablet  3  . trimethoprim (TRIMPEX) 100 MG tablet Take 100 mg by mouth daily.        Review of Systems Review of Systems  Constitutional: Negative for unexpected weight change.  Gastrointestinal:       Constipation  Genitourinary:       Intermittent sharp left groin pains    Blood pressure 140/86, pulse 68, temperature 98 F (36.7 C), temperature source Temporal, resp. rate 18, height 4\' 10"  (1.473 m), weight 130 lb (58.968 kg).  Physical Exam Physical Exam  Constitutional:       Elderly female in no acute distress    Abdominal: Soft. She exhibits no distension and no mass. There is no tenderness.       Absence of umbilicus with midline scar present.  Genitourinary:       Left inguinal incision with firmness and slight swelling.    Data Reviewed Old notes Assessment    Post left inguinal hernia repair groin pain that has been persistent since the operation. The swelling from a hematoma dramatically reduced. It is difficult for me to tell if she just has a firm scar there or possible recurrent hernia.    Plan    CT scan of the pelvis. We'll discuss the results of this when we receive them.       Fajr Fife J 02/16/2011, 3:10 PM

## 2011-02-16 NOTE — Patient Instructions (Signed)
Call one week after your CT scan if you have not heard from [place] about your result.

## 2011-02-20 ENCOUNTER — Other Ambulatory Visit: Payer: Medicare Other

## 2011-02-24 ENCOUNTER — Ambulatory Visit
Admission: RE | Admit: 2011-02-24 | Discharge: 2011-02-24 | Disposition: A | Discharge status: Home / Self Care | Payer: Medicare Other | Source: Ambulatory Visit | Attending: General Surgery | Admitting: General Surgery

## 2011-02-24 DIAGNOSIS — K4091 Unilateral inguinal hernia, without obstruction or gangrene, recurrent: Secondary | ICD-10-CM

## 2011-02-24 MED ORDER — IOHEXOL 300 MG/ML  SOLN
100.0000 mL | Freq: Once | INTRAMUSCULAR | Status: AC | PRN
  Administered 2011-02-24: 100 mL via INTRAVENOUS

## 2011-03-02 ENCOUNTER — Telehealth (INDEPENDENT_AMBULATORY_CARE_PROVIDER_SITE_OTHER): Payer: Self-pay | Admitting: General Surgery

## 2011-03-02 NOTE — Telephone Encounter (Signed)
I spoke with her regarding her CT scan results. I reviewed the CT and also reviewed it with a  radiologist.  There is no evidence of a recurrent left groin hernia as previously suggested on the report. There is thick scar tissue present and less of a fluid collection present. I discussed this with [name]. She still has some intermittent pains but they're not debilitating. I offered to refer her to a pain specialist if it gets worse and told her to call me back if she would like that referral.

## 2011-03-25 ENCOUNTER — Encounter: Payer: Self-pay | Admitting: Gastroenterology

## 2011-03-25 ENCOUNTER — Ambulatory Visit (INDEPENDENT_AMBULATORY_CARE_PROVIDER_SITE_OTHER): Payer: Medicare Other | Admitting: Gastroenterology

## 2011-03-25 DIAGNOSIS — K5900 Constipation, unspecified: Secondary | ICD-10-CM

## 2011-03-25 NOTE — Progress Notes (Signed)
Review of gastrointestinal problems:  1. Large TVA in cecum noted first April 2011  colonoscopy April 2011 by Dr. Jarold Motto; polyp was biopsied but not removed (referred to Dr. Christella Hartigan for polyp removal). Colonoscopy May 2011 by Dr. Christella Hartigan: 2-3cm TVA removed piecmeal, another TA in transverse colon removed.  Repeat colonoscopy March 2012 dr. Christella Hartigan found small amount of residual polyp at cecum, also another small polyp in the transverse colon. Both sites were removed. Pathology revealed tubular adenoma. Recommended recall 3-5 years (2015-2017 depending on her clinical status otherwise)    HPI: This is a  very pleasant 78 year old woman whom I last saw about a year ago at the time of a colonoscopy, see those results summarized above. She is here with her daughter today.   Right groin hernia. Still painful, but repeat CT scan showed only scar tissue.    Has difficult constipation.  Will even have to manually disimpact herslef. Has long term constipation.    Takes colace, also something called "smooth move" an OTC herbal supplement.  Those are the two things she takes   Used to take miralax in past.  She has tried fiber metamucil.    Overall she has lost some weight lately.  She only nibbles.  Will have bm every day to every 3 days.  Never sees blood in her stool.  Has to really push, strain to move bowels (spent 2.5 hours in bathroom recently).   Past Medical History  Diagnosis Date  . Constipation     chronic  . Asthma   . Heart murmur   . Scoliosis     severe, affects her digestion, GI function- chronic pain  . AR (allergic rhinitis)   . Palpitations   . Hyperlipidemia   . Hypertension   . Pneumonia     history of  . DDD (degenerative disc disease)     with injections  . Osteoarthritis     s/p right hip replacement  . Renal insufficiency   . CAD (coronary artery disease)     non obstructive. Cath 2008 with medical treatment    Past Surgical History  Procedure Date  .  Abdominal hysterectomy   . Foot surgery   . Carpal tunnel release     left  . Back surgery 1999    herniated disk  . Colon surgery   . Hernia repair 2012    left inquinal, complicated by large seroma  . Tonsillectomy   . Appendectomy   . Hemorrhoid surgery   . Coronary angioplasty 2008    LAD 40%LCX okRCA 30% EF 60%  . Bladder tack   . Total hip arthroplasty 2009  . Cataract extraction 2010  . Abdominal surgery     benign fatty tumors, also took bilateral ovaries and gallbladder    Current Outpatient Prescriptions  Medication Sig Dispense Refill  . acetaminophen (TYLENOL) 325 MG tablet Take 650 mg by mouth as needed.        . Aloe Vera 470 MG CAPS Take by mouth daily.        Marland Kitchen amLODipine-benazepril (LOTREL) 10-40 MG per capsule take 1 capsule by mouth daily  90 capsule  1  . Bisacodyl (LAXATIVE PO) Take by mouth. Smooth Move  One packet  By mouth at bedtime for laxative.       . Cholecalciferol (VITAMIN D) 2000 UNITS tablet Take 2,000 Units by mouth daily.        . Cyanocobalamin (VITAMIN B 12 PO) Take by mouth.        Marland Kitchen  Ginkgo Biloba 120 MG CAPS Take by mouth as needed.        Marland Kitchen MAGNESIUM ASPARTATE PO Take 1 tablet by mouth daily.        . polyethylene glycol (MIRALAX) packet Take 17 g by mouth as needed.        . potassium chloride SA (K-DUR,KLOR-CON) 20 MEQ tablet 1/2 pill by mouth once daily  45 tablet  3  . trimethoprim (TRIMPEX) 100 MG tablet Take 100 mg by mouth daily.        Allergies as of 03/25/2011 - Review Complete 03/25/2011  Allergen Reaction Noted  . Aspirin    . Celebrex (celecoxib) Nausea Only 04/30/2010  . Detrol (tolterodine tartrate)    . Rosuvastatin    . Simvastatin      Family History  Problem Relation Age of Onset  . Hypertension Mother   . Kidney disease Mother   . Heart disease Father     MI  . Alzheimer's disease Brother   . Cancer Paternal Uncle     esophageal    History   Social History  . Marital Status: Widowed    Spouse Name:  N/A    Number of Children: 5  . Years of Education: N/A   Occupational History  . RETIRED    Social History Main Topics  . Smoking status: Never Smoker   . Smokeless tobacco: Never Used  . Alcohol Use: Yes     Very rare  . Drug Use: No  . Sexually Active: Not on file   Other Topics Concern  . Not on file   Social History Narrative  . No narrative on file      Physical Exam: BP 180/66  Pulse 74  Ht 4\' 10"  (1.473 m)  Wt 126 lb (57.153 kg)  BMI 26.33 kg/m2 Constitutional: Elderly, somewhat frail woman Psychiatric: alert and oriented x3 Abdomen: soft, nontender, nondistended, no obvious ascites, no peritoneal signs, normal bowel sounds     Assessment and plan: 78 y.o. female with chronic constipation, worse recently  We're going to try a bowel purge with MiraLax and then she will adjust her constipation regimen by adding one dose of MiraLax every day to the agents she is already using (an herbal tea called smooth move and Colace once daily). She will call to report on her symptoms in 4-5 weeks.

## 2011-03-25 NOTE — Patient Instructions (Signed)
Bowel purge with one full bottle of miralax over 12-24 hours (mix with water or gatorade).  This will completely clean your colon out. Then continue your daily regimine of "smooth move tea" and the colace every day. But you will also add 1 dose of miralax every day.    You need to call Dr. Christella Hartigan' office in 4-5 weeks to report on your symptoms.   A copy of this information will be made available to Dr. Milinda Antis.

## 2011-05-05 ENCOUNTER — Other Ambulatory Visit: Payer: Self-pay | Admitting: Family Medicine

## 2011-05-05 ENCOUNTER — Telehealth: Payer: Self-pay | Admitting: Gastroenterology

## 2011-05-05 NOTE — Telephone Encounter (Signed)
Pt states the meds Dr Christella Hartigan suggested are working great! She takes Miralax, a stool softener and Smooth Move. Pt will call for problems or questions.

## 2011-07-10 ENCOUNTER — Ambulatory Visit (INDEPENDENT_AMBULATORY_CARE_PROVIDER_SITE_OTHER): Payer: Medicare Other | Admitting: Family Medicine

## 2011-07-10 ENCOUNTER — Encounter: Payer: Self-pay | Admitting: Family Medicine

## 2011-07-10 VITALS — BP 140/78 | HR 63 | Temp 98.0°F | Ht 60.75 in | Wt 131.8 lb

## 2011-07-10 DIAGNOSIS — R609 Edema, unspecified: Secondary | ICD-10-CM

## 2011-07-10 DIAGNOSIS — R42 Dizziness and giddiness: Secondary | ICD-10-CM

## 2011-07-10 MED ORDER — FUROSEMIDE 20 MG PO TABS: ORAL_TABLET | ORAL | Status: DC

## 2011-07-10 NOTE — Progress Notes (Signed)
Subjective:    Patient ID: [name], female    DOB: 1920-11-28, 78 y.o.   MRN: [id number]  HPI Is here for edema - legs are very swollen  Worse than usual - for the last 2 weeks  Has a hx of venous insuff  Does not eat salty foods , and is trying to drink water  Not on feet more than usual  Elevates them whenever she sits down  Has tried and tried support stockings and she just does not tolerate them   She does walk harder on R foot - due to scoliosis and also bad hip  She stays very independent  Her family tries to help out a lot   No cp or sob  No PND or orthopnea    Wt is up 5 lb with bmi of 25   Drinks some coffee - very weak  bp is 140/78     Today No cp or palpitations or headaches or edema  No side effects to medicines   Has been more dizzy lately -- with some sinus tightness   Suspects inner ear problems - has gone on for years Is careful to use her walker and cane  Patient Active Problem List  Diagnoses  . VITAMIN D DEFICIENCY  . HYPERLIPIDEMIA  . HYPERTENSION  . CORONARY ARTERY DISEASE  . VENOUS INSUFFICIENCY  . ALLERGIC RHINITIS  . CONSTIPATION  . RENAL INSUFFICIENCY  . FIBROCYSTIC BREAST DISEASE  . SHOULDER PAIN, RIGHT  . DEGENERATIVE DISC DISEASE  . BAKER'S CYST, RIGHT KNEE  . SYNCOPE  . ORTHOSTATIC DIZZINESS  . FATIGUE  . EDEMA  . PALPITATIONS  . DYSPNEA/SHORTNESS OF BREATH  . INCONTINENCE, URGE  . HYPERGLYCEMIA  . PNEUMONIA, HX OF  . COLONIC POLYPS, HX OF  . BREAST MASS, HX OF  . SYMPTOM, HEADACHE  . URINARY HESITANCY  . SEROMA COMPLICATING A PROCEDURE NEC  . UTI (lower urinary tract infection)  . Urinary frequency  . Right groin pain   Past Medical History  Diagnosis Date  . Constipation     chronic  . Asthma   . Heart murmur   . Scoliosis     severe, affects her digestion, GI function- chronic pain  . AR (allergic rhinitis)   . Palpitations   . Hyperlipidemia   . Hypertension   . Pneumonia     history of  . DDD  (degenerative disc disease)     with injections  . Osteoarthritis     s/p right hip replacement  . Renal insufficiency   . CAD (coronary artery disease)     non obstructive. Cath 2008 with medical treatment   Past Surgical History  Procedure Date  . Abdominal hysterectomy   . Foot surgery   . Carpal tunnel release     left  . Back surgery 1999    herniated disk  . Colon surgery   . Hernia repair 2012    left inquinal, complicated by large seroma  . Tonsillectomy   . Appendectomy   . Hemorrhoid surgery   . Coronary angioplasty 2008    LAD 40%LCX okRCA 30% EF 60%  . Bladder tack   . Total hip arthroplasty 2009  . Cataract extraction 2010  . Abdominal surgery     benign fatty tumors, also took bilateral ovaries and gallbladder   History  Substance Use Topics  . Smoking status: Never Smoker   . Smokeless tobacco: Never Used  . Alcohol Use: Yes  Very rare   Family History  Problem Relation Age of Onset  . Hypertension Mother   . Kidney disease Mother   . Heart disease Father     MI  . Alzheimer's disease Brother   . Cancer Paternal Uncle     esophageal   Allergies  Allergen Reactions  . Aspirin   . Celebrex (Celecoxib) Nausea Only  . Detrol (Tolterodine Tartrate)     REACTION: Not effective  . Rosuvastatin     REACTION: Severe leg edema  . Simvastatin     REACTION: Feet burn   Current Outpatient Prescriptions on File Prior to Visit  Medication Sig Dispense Refill  . acetaminophen (TYLENOL) 325 MG tablet Take 650 mg by mouth as needed.        Marland Kitchen amLODipine-benazepril (LOTREL) 10-40 MG per capsule take 1 capsule by mouth daily  90 capsule  3  . Bisacodyl (LAXATIVE PO) Take by mouth. Smooth Move  One packet  By mouth at bedtime for laxative.       . Cholecalciferol (VITAMIN D) 2000 UNITS tablet Take 2,000 Units by mouth daily.        . Cyanocobalamin (VITAMIN B 12 PO) Take by mouth.        . Ginkgo Biloba 120 MG CAPS Take by mouth as needed.        Marland Kitchen  MAGNESIUM ASPARTATE PO Take 1 tablet by mouth daily.        . polyethylene glycol (MIRALAX) packet Take 17 g by mouth as needed.        . potassium chloride SA (K-DUR,KLOR-CON) 20 MEQ tablet 1/2 pill by mouth once daily  45 tablet  3  . Aloe Vera 470 MG CAPS Take by mouth daily.        Marland Kitchen trimethoprim (TRIMPEX) 100 MG tablet Take 100 mg by mouth daily.         Review of Systems     Objective:   Physical Exam  Constitutional: She appears well-developed and well-nourished. No distress.       Well appearing elderly female  HENT:  Head: Normocephalic and atraumatic.  Mouth/Throat: Oropharynx is clear and moist.  Eyes: Conjunctivae and EOM are normal. Pupils are equal, round, and reactive to light. No scleral icterus.       No nystagmus  Neck: Normal range of motion. Neck supple. No JVD present. No thyromegaly present.  Cardiovascular: Normal rate, regular rhythm and normal heart sounds.   Pulmonary/Chest: Effort normal and breath sounds normal. No respiratory distress. She has no wheezes.       No crackles  Musculoskeletal: She exhibits edema. She exhibits no tenderness.       Trace to one plus ankle edema with very slight pitting Moderate varicosities- no skin changes or ulceration  Baseline scoliosis noted    Lymphadenopathy:    She has no cervical adenopathy.  Neurological: She is alert. She has normal reflexes. No cranial nerve deficit. She exhibits normal muscle tone. Coordination normal.  Skin: Skin is warm and dry. No rash noted. No erythema. No pallor.  Psychiatric: She has a normal mood and affect.          Assessment & Plan:

## 2011-07-10 NOTE — Patient Instructions (Addendum)
I think your swelling is from bad veins in your legs - and will always be worse in warm weather Try to eat a diet low in sodium / salt Elevate legs when ever you are sitting  Stay active  Try lasix (a fluid pill) once weekly -- the day you take it - it will make you urinate more  If you have any problems or side effects, stop it and let me know  Follow up with me in July or august for annual exam with labs prior (any 30 min appt is fine)  If you get more dizzy, let me know - and use your walker for safety to prevent falls

## 2011-07-10 NOTE — Assessment & Plan Note (Signed)
This is worse in hot weather due to venous insuff, unfortunately pt does not tolerate supp hose Rev lifestyle changes to reduce swelling Will try conservative dose of lasix 20 mg once weekly to help with swelling No cardiac red flags today Disc s/s to watch for  Will f/u 1-2 mo for labs and visit for re check and other chronic conditions

## 2011-07-12 DIAGNOSIS — R42 Dizziness and giddiness: Secondary | ICD-10-CM | POA: Insufficient documentation

## 2011-07-12 NOTE — Assessment & Plan Note (Signed)
Intermittent/ chronic - in past orthostatic , now may be more vertiginous  Stable today Disc fall precautions in detail  Does not wish further w/u for this

## 2011-09-20 ENCOUNTER — Telehealth: Payer: Self-pay | Admitting: Family Medicine

## 2011-09-20 DIAGNOSIS — I10 Essential (primary) hypertension: Secondary | ICD-10-CM

## 2011-09-20 DIAGNOSIS — R609 Edema, unspecified: Secondary | ICD-10-CM

## 2011-09-20 NOTE — Telephone Encounter (Signed)
Message copied by Judy Pimple on Sun Sep 20, 2011 10:08 PM ------      Message from: Alvina Chou      Created: Thu Sep 17, 2011  3:42 PM      Regarding: lab orders for Monday, 8.19.13       Labs w/o f/u appt

## 2011-09-21 ENCOUNTER — Other Ambulatory Visit (INDEPENDENT_AMBULATORY_CARE_PROVIDER_SITE_OTHER): Payer: Medicare Other

## 2011-09-21 DIAGNOSIS — R609 Edema, unspecified: Secondary | ICD-10-CM

## 2011-09-21 DIAGNOSIS — I10 Essential (primary) hypertension: Secondary | ICD-10-CM

## 2011-09-21 LAB — BASIC METABOLIC PANEL
BUN: 22 mg/dL (ref 6–23)
Calcium: 9.4 mg/dL (ref 8.4–10.5)
Creatinine, Ser: 1 mg/dL (ref 0.4–1.2)
GFR: 54.83 mL/min — ABNORMAL LOW (ref >60.00)
Glucose, Bld: 97 mg/dL (ref 70–99)
Potassium: 4.3 mEq/L (ref 3.5–5.1)

## 2011-10-15 ENCOUNTER — Telehealth: Payer: Self-pay | Admitting: Family Medicine

## 2011-10-15 NOTE — Telephone Encounter (Signed)
Caller: Diane/Child; Patient Name: [name]; PCP: [facility]); Best Callback Phone Number: [phone]; Reason for call: ankle discoloration.  Patient states bluishness has been present since 10/13/11 with bulging vein running up the right calf.  Vein/cord is discolored.  Per protocol, disposition See Immediately; per office protocol, info to office staff via Epic/high priority for office staff management.   May reach caller at [phone].

## 2011-10-15 NOTE — Telephone Encounter (Signed)
H/o CVI. any pain?  How are pulses?   If pain or ulcer or concern for necrosis, recommend UCC/ER tonight.  O/w schedule in office tomorrow.

## 2011-10-15 NOTE — Telephone Encounter (Signed)
Patient says she has an appt tomorrow at 12:30 pm.  She is not having any pain now and no sign of necrosis or ulcer.  Advised to go to [facility] or ER if this worsens prior to appt time.

## 2011-10-16 ENCOUNTER — Encounter: Payer: Self-pay | Admitting: Family Medicine

## 2011-10-16 ENCOUNTER — Ambulatory Visit (INDEPENDENT_AMBULATORY_CARE_PROVIDER_SITE_OTHER): Payer: Medicare Other | Admitting: Family Medicine

## 2011-10-16 VITALS — BP 140/78 | HR 83 | Temp 98.2°F | Ht 58.5 in | Wt 125.8 lb

## 2011-10-16 DIAGNOSIS — R350 Frequency of micturition: Secondary | ICD-10-CM

## 2011-10-16 DIAGNOSIS — I8390 Asymptomatic varicose veins of unspecified lower extremity: Secondary | ICD-10-CM

## 2011-10-16 DIAGNOSIS — I8393 Asymptomatic varicose veins of bilateral lower extremities: Secondary | ICD-10-CM | POA: Insufficient documentation

## 2011-10-16 DIAGNOSIS — R21 Rash and other nonspecific skin eruption: Secondary | ICD-10-CM

## 2011-10-16 DIAGNOSIS — Z23 Encounter for immunization: Secondary | ICD-10-CM

## 2011-10-16 LAB — POCT URINALYSIS DIPSTICK
Bilirubin, UA: NEGATIVE
Leukocytes, UA: NEGATIVE
Nitrite, UA: NEGATIVE
Protein, UA: 300
Urobilinogen, UA: 0.2
pH, UA: 6

## 2011-10-16 NOTE — Patient Instructions (Addendum)
We will do referral to dermatology at check out - for rash on chest  Try the support hose for your legs- wear during the day  Elevate legs when sitting as much as you can Walk as much as you can  See your orthopedic doctor for hip issues

## 2011-10-16 NOTE — Assessment & Plan Note (Signed)
Recurrent rash on chest - itchy, tried different products incl daughter's steroid cream  ? If any poss this could be skin cancer related or other  Will ref to derm for further eval

## 2011-10-16 NOTE — Assessment & Plan Note (Signed)
Getting worse - and pt unable to elevate legs well when sitting due to scoliosis, also more edema in hot weather No ulceration Again rec supp hose  Will try them to the thigh 15-20 mm hg  Update if not helpful or if problems

## 2011-10-16 NOTE — Progress Notes (Signed)
Subjective:    Patient ID: [name], female    DOB: [birth year], 78 y.o.   MRN: [id number]  HPI Here for varicose veins  They are "acting up " lately  A vein "burst" and bruised in R leg this week  Legs stay cold   Veins are bigger over time   Red rash over chest on and off  Uses peroxide  Used some steroid creams- helps bit  Rough and itchy Nothing works   Urine odor lately - worse than usual occ spells of burning  occ leaking  Took incontinence med for a while - trouble emptying  Does have frequency   Patient Active Problem List  Diagnosis  . VITAMIN D DEFICIENCY  . HYPERLIPIDEMIA  . HYPERTENSION  . CORONARY ARTERY DISEASE  . VENOUS INSUFFICIENCY  . ALLERGIC RHINITIS  . CONSTIPATION  . RENAL INSUFFICIENCY  . FIBROCYSTIC BREAST DISEASE  . SHOULDER PAIN, RIGHT  . DEGENERATIVE DISC DISEASE  . BAKER'S CYST, RIGHT KNEE  . SYNCOPE  . ORTHOSTATIC DIZZINESS  . FATIGUE  . EDEMA  . PALPITATIONS  . DYSPNEA/SHORTNESS OF BREATH  . INCONTINENCE, URGE  . HYPERGLYCEMIA  . PNEUMONIA, HX OF  . COLONIC POLYPS, HX OF  . BREAST MASS, HX OF  . SYMPTOM, HEADACHE  . URINARY HESITANCY  . SEROMA COMPLICATING A PROCEDURE NEC  . UTI (lower urinary tract infection)  . Urinary frequency  . Right groin pain  . Dizziness   Past Medical History  Diagnosis Date  . Constipation     chronic  . Asthma   . Heart murmur   . Scoliosis     severe, affects her digestion, GI function- chronic pain  . AR (allergic rhinitis)   . Palpitations   . Hyperlipidemia   . Hypertension   . Pneumonia     history of  . DDD (degenerative disc disease)     with injections  . Osteoarthritis     s/p right hip replacement  . Renal insufficiency   . CAD (coronary artery disease)     non obstructive. Cath 2008 with medical treatment   Past Surgical History  Procedure Date  . Abdominal hysterectomy   . Foot surgery   . Carpal tunnel release     left  . Back surgery 1999    herniated  disk  . Colon surgery   . Hernia repair 2012    left inquinal, complicated by large seroma  . Tonsillectomy   . Appendectomy   . Hemorrhoid surgery   . Coronary angioplasty 2008    LAD 40%LCX okRCA 30% EF 60%  . Bladder tack   . Total hip arthroplasty 2009  . Cataract extraction 2010  . Abdominal surgery     benign fatty tumors, also took bilateral ovaries and gallbladder   History  Substance Use Topics  . Smoking status: Never Smoker   . Smokeless tobacco: Never Used  . Alcohol Use: Yes     Very rare   Family History  Problem Relation Age of Onset  . Hypertension Mother   . Kidney disease Mother   . Heart disease Father     MI  . Alzheimer's disease Brother   . Cancer Paternal Uncle     esophageal   Allergies  Allergen Reactions  . Aspirin   . Celebrex (Celecoxib) Nausea Only  . Detrol (Tolterodine Tartrate)     REACTION: Not effective  . Rosuvastatin     REACTION: Severe leg edema  . Simvastatin  REACTION: Feet burn   Current Outpatient Prescriptions on File Prior to Visit  Medication Sig Dispense Refill  . acetaminophen (TYLENOL) 325 MG tablet Take 650 mg by mouth as needed.        . Aloe Vera 470 MG CAPS Take by mouth daily.        Marland Kitchen amLODipine-benazepril (LOTREL) 10-40 MG per capsule take 1 capsule by mouth daily  90 capsule  3  . Bisacodyl (LAXATIVE PO) Take by mouth. Smooth Move  One packet  By mouth at bedtime for laxative.       . Cholecalciferol (VITAMIN D) 2000 UNITS tablet Take 2,000 Units by mouth daily.        . Cyanocobalamin (VITAMIN B 12 PO) Take by mouth.        . furosemide (LASIX) 20 MG tablet Take one pill by mouth once weekly to help with leg swelling  30 tablet  0  . Ginkgo Biloba 120 MG CAPS Take by mouth as needed.        Marland Kitchen MAGNESIUM ASPARTATE PO Take 1 tablet by mouth daily.        . polyethylene glycol (MIRALAX) packet Take 17 g by mouth as needed.        . potassium chloride SA (K-DUR,KLOR-CON) 20 MEQ tablet 1/2 pill by mouth once  daily  45 tablet  3  . trimethoprim (TRIMPEX) 100 MG tablet Take 100 mg by mouth daily.      . vitamin E (VITAMIN E) 1000 UNIT capsule Take 1,000 Units by mouth daily.          Review of Systems Review of Systems  Constitutional: Negative for fever, appetite change, fatigue and unexpected weight change.  Eyes: Negative for pain and visual disturbance.  Respiratory: Negative for cough and shortness of breath.   Cardiovascular: Negative for cp or palpitations    Gastrointestinal: Negative for nausea, diarrhea and constipation.  Genitourinary: Negative for urgency and frequency. pos for urine odor,neg for blood in urine Skin: Negative for pallor and pos for itchy rash  MSK pos for occ swelling of ankles with varicose veins Neurological: Negative for weakness, light-headedness, numbness and headaches.  Hematological: Negative for adenopathy. Does not bruise/bleed easily.  Psychiatric/Behavioral: Negative for dysphoric mood. The patient is not nervous/anxious.         Objective:   Physical Exam  Constitutional: She is oriented to person, place, and time. She appears well-developed and well-nourished. No distress.       Frail appearing elderly female  HENT:  Head: Normocephalic and atraumatic.  Left Ear: External ear normal.  Mouth/Throat: Oropharynx is clear and moist.  Eyes: Conjunctivae normal and EOM are normal. Pupils are equal, round, and reactive to light. Right eye exhibits no discharge. Left eye exhibits no discharge. No scleral icterus.  Neck: Normal range of motion. Neck supple. No JVD present. Carotid bruit is not present. No thyromegaly present.  Cardiovascular: Normal rate, regular rhythm, normal heart sounds and intact distal pulses.  Exam reveals no gallop.   Pulmonary/Chest: Effort normal and breath sounds normal. No respiratory distress. She has no wheezes.       No crackles  Abdominal: Soft. Bowel sounds are normal. She exhibits no distension, no abdominal bruit and no  mass. There is no tenderness.       No suprapubic tenderness or fullness  No cva tenderness   Musculoskeletal: She exhibits no edema and no tenderness.       Diffuse varicosities in legs from knee  down Compressible and non tender No ulceration Many spider veins in ankles creating a blue color Arterial perf intact No palp cords/ redness/ swelling Neg homann's sign  Lymphadenopathy:    She has no cervical adenopathy.  Neurological: She is oriented to person, place, and time. She has normal reflexes. No cranial nerve deficit. She exhibits normal muscle tone. Coordination normal.  Skin: Skin is warm and dry. Rash noted. There is erythema. No pallor.       Areas of erythema and scale over anterior chest and upper back Some AKs and SKs noted   Psychiatric: She has a normal mood and affect.          Assessment & Plan:

## 2011-10-16 NOTE — Assessment & Plan Note (Addendum)
ua today - mod blood , otherwise neg  Hx of incontinence with bladder tack in past  No blood in urine or fever  Will cx and update

## 2011-11-22 IMAGING — CT CT ABD-PELV W/ CM
2 of 5 series · 16 of 46 positions shown, 18 images · IV contrast (Omnipaque 300)
Comparison: None.

CLINICAL DATA: Lower abdominal pain, rectal pressure, dark stools

CT ABDOMEN AND PELVIS WITH CONTRAST
TECHNIQUE: Multidetector CT imaging of the abdomen and pelvis was
performed following the standard protocol during bolus
administration of intravenous contrast.
Contrast: 80 ml [id number]

[Series 2: abd/ pel · axial · 0.78mm/px · z∈[-350,-54]mm · 13 of 67 slices shown, 15 images]
[im 4/67  soft-tissue]
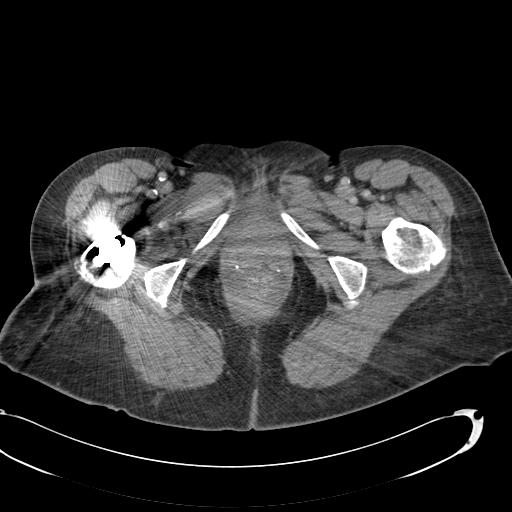
[im 4/67  bone]
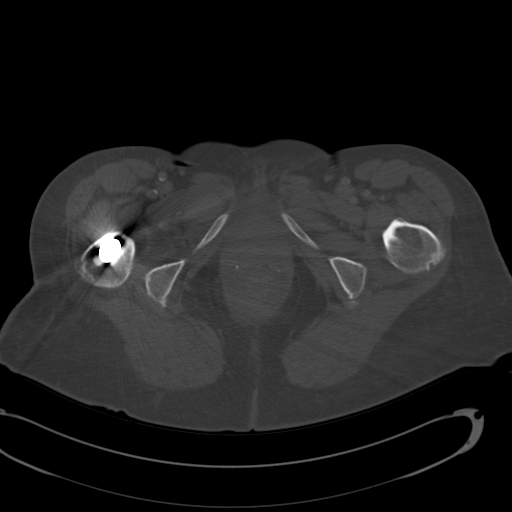
[im 8/67  soft-tissue]
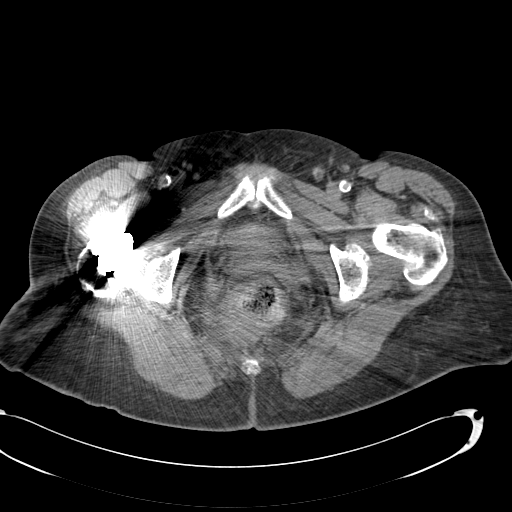
[im 15/67  soft-tissue]
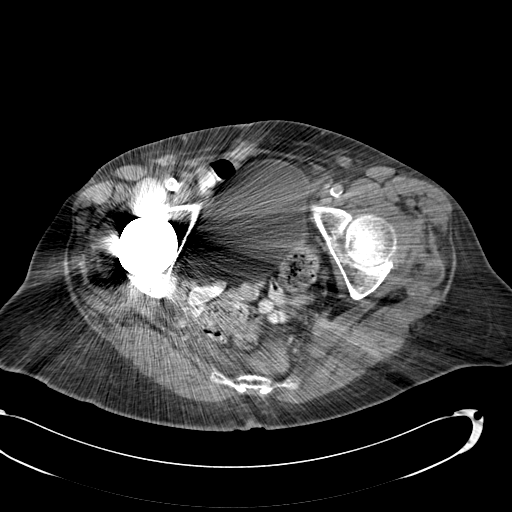
[im 19/67  soft-tissue]
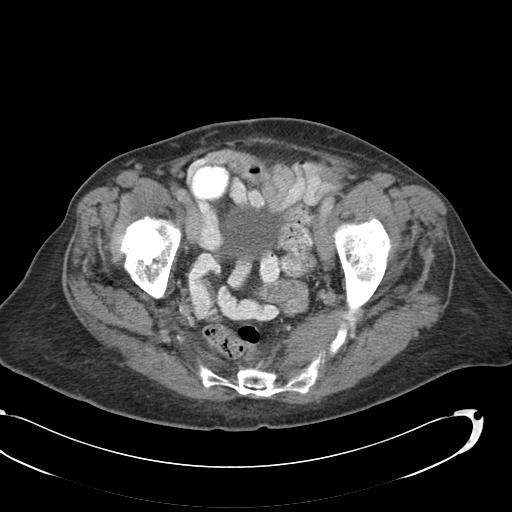
[im 23/67  soft-tissue]
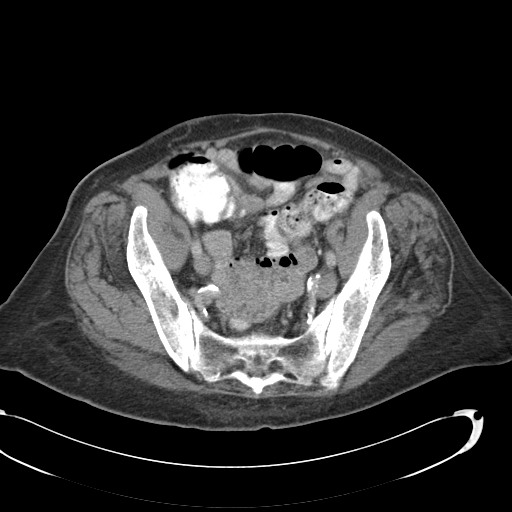
[im 30/67  soft-tissue]
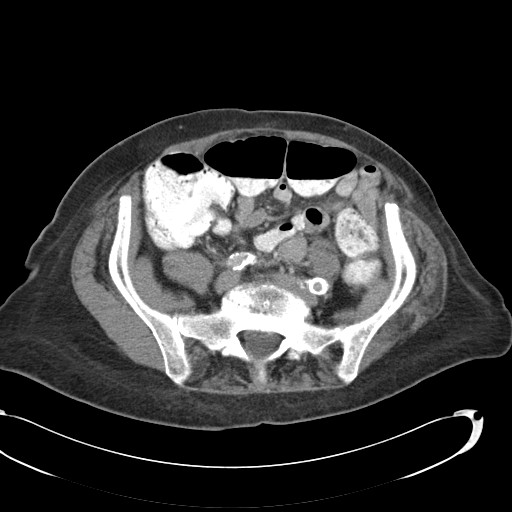
[im 34/67  soft-tissue]
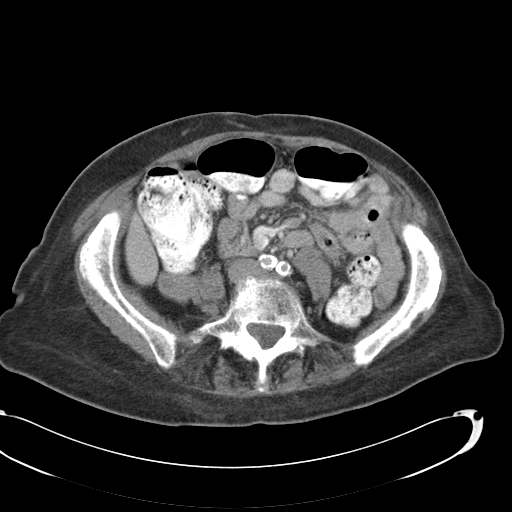
[im 37/67  soft-tissue]
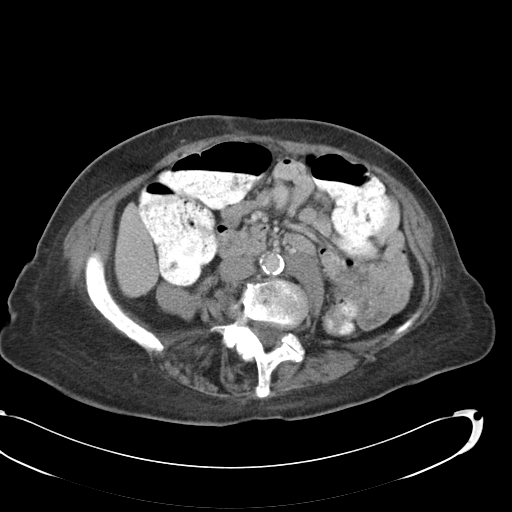
[im 45/67  soft-tissue]
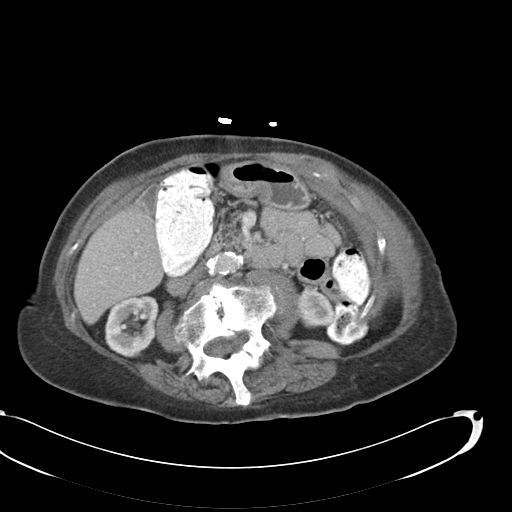
[im 45/67  bone]
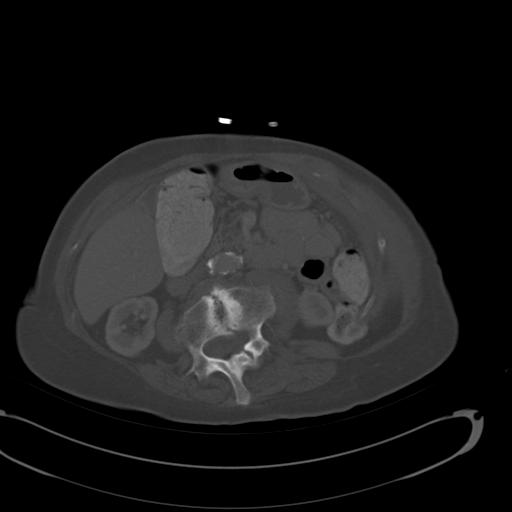
[im 48/67  soft-tissue]
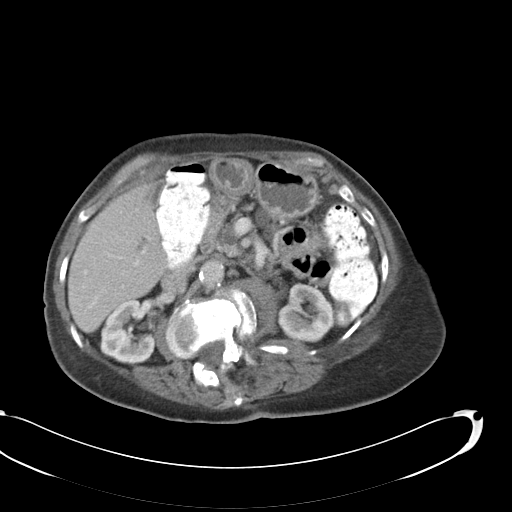
[im 52/67  soft-tissue]
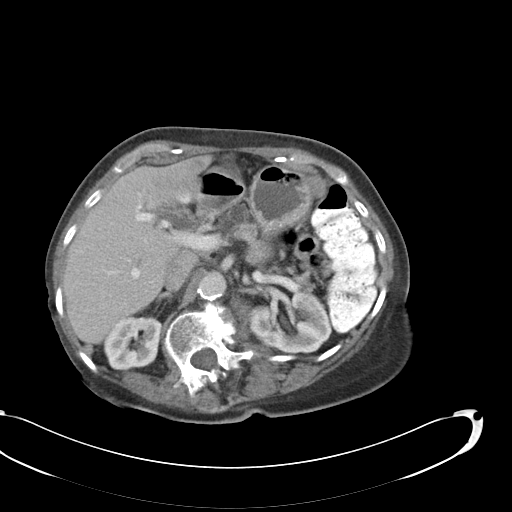
[im 59/67  soft-tissue]
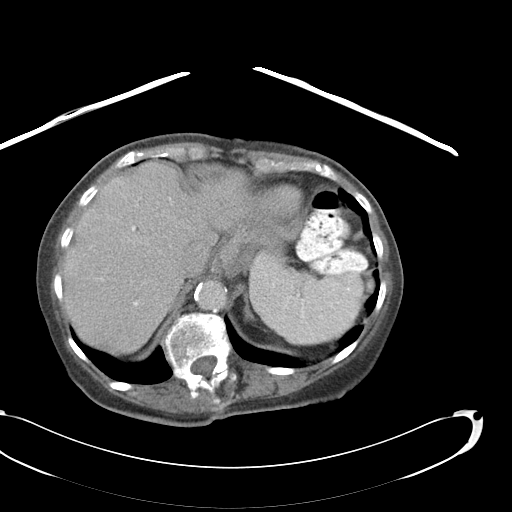
[im 63/67  soft-tissue]
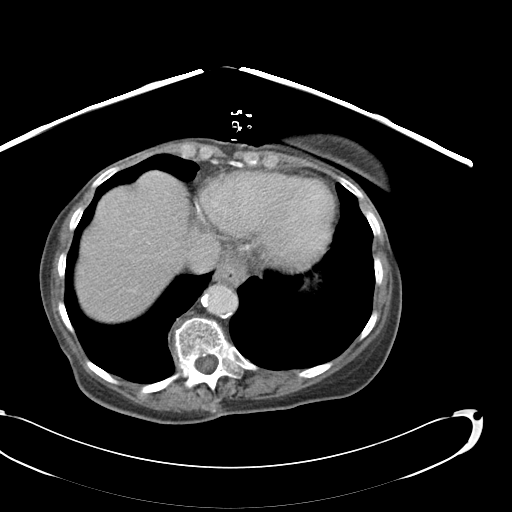

[Series 602: <mpr range> · coronal · 0.78mm/px · 3 of 125 slices shown]
[im 42/125  soft-tissue]
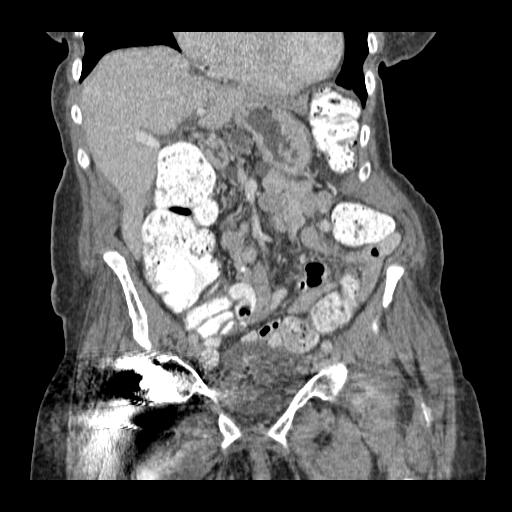
[im 56/125  soft-tissue]
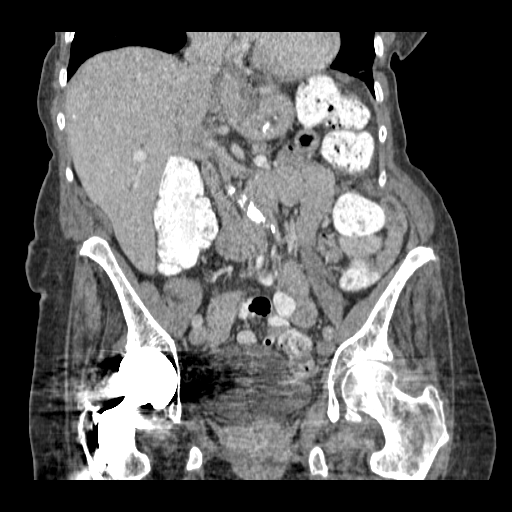
[im 69/125  soft-tissue]
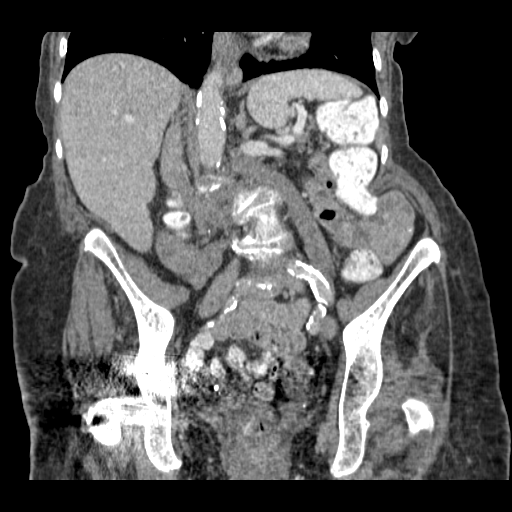

[16 of 46 positions shown; findings below may reference images not displayed]

FINDINGS: The lung bases are clear.  There is mild cardiomegaly
present.  There may be a small hiatal hernia present.  The liver
enhances with no focal abnormality and no ductal dilatation is
seen.  The gallbladder is slightly contracted and no calcified
gallstones are seen.  The pancreas appears atrophic with possible
mild fatty infiltration.  There is some low attenuation throughout
the head and the body of the pancreas which could represent a
dilated duct or possibly small pseudocysts.  Correlation with
pancreatic enzymes is recommended.  If further assessment is
warranted clinically then MRI would be suggested.  The adrenal
glands and spleen are unremarkable.  The stomach is not well
distended.  The abdominal aorta is normal in caliber and very
ectatic with calcification diffusely.  No adenopathy is seen.

The urinary bladder is unremarkable although linear artifacts from
right total hip replacement do obscure some anatomic detail.  There
is feces noted to the rectum where there is some thickening of the
mucosa.  Either a neoplastic or inflammatory process of the rectum
cannot be excluded.  No adjacent adenopathy or fluid is seen.
There is significant degenerative change throughout the lumbar
spine where there is a severe lumbar scoliosis present.  The bones
are diffusely osteopenic.
IMPRESSION: 1.  The some narrowing of the rectum with slightly thickened
mucosa.  Cannot exclude inflammatory or neoplastic process.
Correlate clinically.
2.  Low attenuation areas within the body and head of the pancreas
may represent pseudocysts.  If further assessment is warranted in
this age patient, MRI may be helpful.
3.  Probable small hiatal hernia.

## 2012-04-25 ENCOUNTER — Ambulatory Visit (INDEPENDENT_AMBULATORY_CARE_PROVIDER_SITE_OTHER): Payer: Medicare Other | Admitting: Family Medicine

## 2012-04-25 ENCOUNTER — Encounter: Payer: Self-pay | Admitting: Family Medicine

## 2012-04-25 VITALS — BP 142/96 | HR 70 | Temp 97.8°F | Ht 58.5 in

## 2012-04-25 DIAGNOSIS — K5900 Constipation, unspecified: Secondary | ICD-10-CM

## 2012-04-25 NOTE — Patient Instructions (Addendum)
For constipation- I would like to have you drink a lot more fluids- 12 or more servings per day  Also be as active as you can  Continue your current medicines Increase miralax to one serving mixed with water three times per day  Then after your bowels start to move more regularly  - see how much you need to maintain a schedule  Let me know if worse or if you develop abdominal pain or vomiting

## 2012-04-25 NOTE — Assessment & Plan Note (Signed)
Chronic constipation - with long hx of laxative use / last colonosc 3/12 and recall due 3 y due to adenomatous polyps Also severe scoliosis  I think at this point she becoming more or less immune to laxatives and does not drink enough fluids  Asked her to inc water intake and to get more aggressive with miralax -starting at tid servings and then titrate to effect Can continue her other products Also activity/ more walking in safe environment  Will update if not imp or if abd pain or blood in stool

## 2012-04-25 NOTE — Progress Notes (Signed)
Subjective:    Patient ID: [name], female    DOB: 09/08/1920, 79 y.o.   MRN: [id number]  HPI Here with constipation - and this is long term and chronic  Very hard time moving her bowels - and it is causing rectal pain   Last colonscopy- and 3/12   Right now - using  smoothe move miralax Stool softener  Takes 1 magnesium pill each night  No longer takes aloe vera   Patient Active Problem List  Diagnosis  . VITAMIN D DEFICIENCY  . HYPERLIPIDEMIA  . HYPERTENSION  . CORONARY ARTERY DISEASE  . VENOUS INSUFFICIENCY  . ALLERGIC RHINITIS  . CONSTIPATION  . RENAL INSUFFICIENCY  . FIBROCYSTIC BREAST DISEASE  . SHOULDER PAIN, RIGHT  . DEGENERATIVE DISC DISEASE  . BAKER'S CYST, RIGHT KNEE  . SYNCOPE  . ORTHOSTATIC DIZZINESS  . FATIGUE  . EDEMA  . PALPITATIONS  . DYSPNEA/SHORTNESS OF BREATH  . INCONTINENCE, URGE  . HYPERGLYCEMIA  . PNEUMONIA, HX OF  . COLONIC POLYPS, HX OF  . BREAST MASS, HX OF  . SYMPTOM, HEADACHE  . URINARY HESITANCY  . SEROMA COMPLICATING A PROCEDURE NEC  . UTI (lower urinary tract infection)  . Urinary frequency  . Right groin pain  . Dizziness  . Varicose veins of legs  . Rash and nonspecific skin eruption   Past Medical History  Diagnosis Date  . Constipation     chronic  . Asthma   . Heart murmur   . Scoliosis     severe, affects her digestion, GI function- chronic pain  . AR (allergic rhinitis)   . Palpitations   . Hyperlipidemia   . Hypertension   . Pneumonia     history of  . DDD (degenerative disc disease)     with injections  . Osteoarthritis     s/p right hip replacement  . Renal insufficiency   . CAD (coronary artery disease)     non obstructive. Cath 2008 with medical treatment   Past Surgical History  Procedure Laterality Date  . Abdominal hysterectomy    . Foot surgery    . Carpal tunnel release      left  . Back surgery  1999    herniated disk  . Colon surgery    . Hernia repair  2012    left inquinal,  complicated by large seroma  . Tonsillectomy    . Appendectomy    . Hemorrhoid surgery    . Coronary angioplasty  2008    LAD 40%LCX okRCA 30% EF 60%  . Bladder tack    . Total hip arthroplasty  2009  . Cataract extraction  2010  . Abdominal surgery      benign fatty tumors, also took bilateral ovaries and gallbladder   History  Substance Use Topics  . Smoking status: Never Smoker   . Smokeless tobacco: Never Used  . Alcohol Use: Yes     Comment: Very rare   Family History  Problem Relation Age of Onset  . Hypertension Mother   . Kidney disease Mother   . Heart disease Father     MI  . Alzheimer's disease Brother   . Cancer Paternal Uncle     esophageal   Allergies  Allergen Reactions  . Aspirin   . Celebrex (Celecoxib) Nausea Only  . Detrol (Tolterodine Tartrate)     REACTION: Not effective  . Rosuvastatin     REACTION: Severe leg edema  . Simvastatin  REACTION: Feet burn   Current Outpatient Prescriptions on File Prior to Visit  Medication Sig Dispense Refill  . acetaminophen (TYLENOL) 325 MG tablet Take 650 mg by mouth as needed.        . Aloe Vera 470 MG CAPS Take by mouth daily.        Marland Kitchen amLODipine-benazepril (LOTREL) 10-40 MG per capsule take 1 capsule by mouth daily  90 capsule  3  . Bisacodyl (LAXATIVE PO) Take by mouth. Smooth Move  One packet  By mouth at bedtime for laxative.       . Cholecalciferol (VITAMIN D) 2000 UNITS tablet Take 2,000 Units by mouth daily.        . Cyanocobalamin (VITAMIN B 12 PO) Take by mouth.        . furosemide (LASIX) 20 MG tablet Take one pill by mouth once weekly to help with leg swelling  30 tablet  0  . Ginkgo Biloba 120 MG CAPS Take by mouth as needed.        Marland Kitchen MAGNESIUM ASPARTATE PO Take 1 tablet by mouth daily.        . NON FORMULARY Support stockings 15-20 mm Hg To the thigh For varicose veins  Wear daily and as needed      . polyethylene glycol (MIRALAX) packet Take 17 g by mouth as needed.        . potassium  chloride SA (K-DUR,KLOR-CON) 20 MEQ tablet 1/2 pill by mouth once daily  45 tablet  3  . trimethoprim (TRIMPEX) 100 MG tablet Take 100 mg by mouth daily.      . vitamin E (VITAMIN E) 1000 UNIT capsule Take 1,000 Units by mouth daily.       No current facility-administered medications on file prior to visit.      Review of Systems Review of Systems  Constitutional: Negative for fever, appetite change, fatigue and unexpected weight change.  Eyes: Negative for pain and visual disturbance.  Respiratory: Negative for cough and shortness of breath.   Cardiovascular: Negative for cp or palpitations    Gastrointestinal: Negative for nausea, diarrhea and neg for abd pain , pos for bloating and discomfort, neg for blood in stool or dark stool Genitourinary: Negative for urgency and frequency.  Skin: Negative for pallor or rash   Neurological: Negative for weakness, light-headedness, numbness and headaches.  Hematological: Negative for adenopathy. Does not bruise/bleed easily.  Psychiatric/Behavioral: Negative for dysphoric mood. The patient is not nervous/anxious.         Objective:   Physical Exam  Constitutional: She appears well-developed and well-nourished. No distress.  Well appearing elderly female with kyphoscoliosis  HENT:  Head: Normocephalic and atraumatic.  Eyes: Conjunctivae and EOM are normal. Pupils are equal, round, and reactive to light. No scleral icterus.  Neck: Normal range of motion. Neck supple. No thyromegaly present.  Cardiovascular: Normal rate and regular rhythm.  Exam reveals no gallop.   Pulmonary/Chest: Effort normal and breath sounds normal. No respiratory distress. She has no wheezes.  Abdominal: Soft. Bowel sounds are normal. She exhibits no distension and no mass. There is no tenderness. There is no rebound and no guarding.  Musculoskeletal: She exhibits no edema.  Lymphadenopathy:    She has no cervical adenopathy.  Neurological: She is alert. She has  normal reflexes. No cranial nerve deficit. She exhibits normal muscle tone. Coordination normal.  Skin: Skin is warm and dry. No rash noted. No erythema. No pallor.  Psychiatric: She has a normal mood  and affect.          Assessment & Plan:

## 2012-05-19 ENCOUNTER — Other Ambulatory Visit: Payer: Self-pay | Admitting: Family Medicine

## 2012-05-19 NOTE — Telephone Encounter (Signed)
done

## 2012-05-19 NOTE — Telephone Encounter (Signed)
No recent f/u appt and no future appt, ok to refill?

## 2012-05-19 NOTE — Telephone Encounter (Signed)
Please refil for 6 months-thanks 

## 2012-09-08 ENCOUNTER — Telehealth: Payer: Self-pay | Admitting: Family Medicine

## 2012-09-08 DIAGNOSIS — I10 Essential (primary) hypertension: Secondary | ICD-10-CM

## 2012-09-08 DIAGNOSIS — E785 Hyperlipidemia, unspecified: Secondary | ICD-10-CM

## 2012-09-08 DIAGNOSIS — E559 Vitamin D deficiency, unspecified: Secondary | ICD-10-CM

## 2012-09-08 DIAGNOSIS — R7309 Other abnormal glucose: Secondary | ICD-10-CM

## 2012-09-08 DIAGNOSIS — N259 Disorder resulting from impaired renal tubular function, unspecified: Secondary | ICD-10-CM

## 2012-09-08 NOTE — Telephone Encounter (Signed)
See prev note

## 2012-09-08 NOTE — Telephone Encounter (Signed)
The patient called and stated she felt she needed to do lab work next week. I went ahead and scheduled her, but did not see any orders listed.  Let me know if she needs re-scheduled and I'll be happy to move her or change her apt to an ov. Thanks!

## 2012-09-09 NOTE — Telephone Encounter (Signed)
Orders for future labs done Please have her f/u in the fall if not already scheduled -thanks

## 2012-09-12 NOTE — Telephone Encounter (Signed)
Pt's daughter wants to do the f/u an labs on the same day so pt wouldn't have to come out twice, scheduled f/u for 09/14/12 and advised pt's daughter we can do labs at her f/u appt.

## 2012-09-14 ENCOUNTER — Ambulatory Visit (INDEPENDENT_AMBULATORY_CARE_PROVIDER_SITE_OTHER): Payer: Medicare Other | Admitting: Family Medicine

## 2012-09-14 ENCOUNTER — Encounter: Payer: Self-pay | Admitting: Family Medicine

## 2012-09-14 VITALS — BP 148/70 | HR 71 | Temp 98.4°F | Ht 58.5 in | Wt 130.5 lb

## 2012-09-14 DIAGNOSIS — R609 Edema, unspecified: Secondary | ICD-10-CM

## 2012-09-14 DIAGNOSIS — E559 Vitamin D deficiency, unspecified: Secondary | ICD-10-CM

## 2012-09-14 DIAGNOSIS — I10 Essential (primary) hypertension: Secondary | ICD-10-CM

## 2012-09-14 DIAGNOSIS — E785 Hyperlipidemia, unspecified: Secondary | ICD-10-CM

## 2012-09-14 DIAGNOSIS — R7309 Other abnormal glucose: Secondary | ICD-10-CM

## 2012-09-14 DIAGNOSIS — N259 Disorder resulting from impaired renal tubular function, unspecified: Secondary | ICD-10-CM

## 2012-09-14 LAB — COMPREHENSIVE METABOLIC PANEL
Albumin: 4.2 g/dL (ref 3.5–5.2)
BUN: 23 mg/dL (ref 6–23)
CO2: 27 mEq/L (ref 19–32)
Calcium: 9.4 mg/dL (ref 8.4–10.5)
Chloride: 99 mEq/L (ref 96–112)
Glucose, Bld: 94 mg/dL (ref 70–99)
Potassium: 3.5 mEq/L (ref 3.5–5.1)
Sodium: 135 mEq/L (ref 135–145)
Total Protein: 7 g/dL (ref 6.0–8.3)

## 2012-09-14 LAB — CBC WITH DIFFERENTIAL/PLATELET
Basophils Relative: 0.3 % (ref 0.0–3.0)
Eosinophils Relative: 1.7 % (ref 0.0–5.0)
Lymphocytes Relative: 18 % (ref 12.0–46.0)
MCV: 93.6 fl (ref 78.0–100.0)
Monocytes Relative: 5.3 % (ref 3.0–12.0)
Neutrophils Relative %: 74.7 % (ref 43.0–77.0)
RBC: 4.65 Mil/uL (ref 3.87–5.11)
WBC: 6.4 10*3/uL (ref 4.5–10.5)

## 2012-09-14 LAB — HEMOGLOBIN A1C: Hgb A1c MFr Bld: 5.6 % (ref 4.6–6.5)

## 2012-09-14 LAB — TSH: TSH: 1.48 u[IU]/mL (ref 0.35–5.50)

## 2012-09-14 MED ORDER — AMLODIPINE BESY-BENAZEPRIL HCL 10-40 MG PO CAPS: 1.0000 | ORAL_CAPSULE | Freq: Every day | ORAL | Status: DC

## 2012-09-14 NOTE — Progress Notes (Signed)
Subjective:    Patient ID: [name], female    DOB: 04-04-1920, 79 y.o.   MRN: [id number]  HPI Here for f/u of chronic medical problems  She really feels her age  Less energy  More aches and pains  Her memory is not as good as it used to be but she can concentrate and does not get confused or lost Does get frustrated easily  She does better if she is busy and social  Has a positive attitude and stays independent   Wt is up 5 lb with bmi of 26 She is eating very well -has meals brought to her - 3 meals per day- and that is really helping   bp is stable today  No cp or palpitations or headaches or edema  No side effects to medicines  BP Readings from Last 3 Encounters:  09/14/12 148/70  04/25/12 142/96  10/16/11 140/78     Hx of hyperlipidemia  Lab Results  Component Value Date   CHOL 210* 12/04/2010   HDL 60.20 12/04/2010   LDLCALC 118* 10/11/2007   LDLDIRECT 143.9 12/04/2010   TRIG 98.0 12/04/2010   CHOLHDL 3 12/04/2010   Intol of statins  D def in past Hx of LE edema and venous insuff-not as bad as it used to be- she does elevate feet more  Patient Active Problem List   Diagnosis Date Noted  . Varicose veins of legs 10/16/2011  . Rash and nonspecific skin eruption 10/16/2011  . Dizziness 07/12/2011  . Right groin pain 02/16/2011  . Urinary frequency 11/15/2010  . URINARY HESITANCY 03/07/2010  . SEROMA COMPLICATING A PROCEDURE NEC 03/07/2010  . COLONIC POLYPS, HX OF 12/06/2009  . SHOULDER PAIN, RIGHT 10/15/2009  . HYPERGLYCEMIA 10/15/2009  . CORONARY ARTERY DISEASE 05/14/2009  . CONSTIPATION 05/14/2009  . VENOUS INSUFFICIENCY 05/01/2009  . VITAMIN D DEFICIENCY 12/31/2008  . RENAL INSUFFICIENCY 12/31/2008  . ORTHOSTATIC DIZZINESS 10/01/2008  . SYNCOPE 09/21/2008  . EDEMA 06/14/2007  . BAKER'S CYST, RIGHT KNEE 10/20/2006  . SYMPTOM, HEADACHE 08/09/2006  . FATIGUE 06/29/2006  . PALPITATIONS 06/29/2006  . DYSPNEA/SHORTNESS OF BREATH 06/29/2006  .  HYPERLIPIDEMIA 06/21/2006  . HYPERTENSION 06/21/2006  . ALLERGIC RHINITIS 06/21/2006  . FIBROCYSTIC BREAST DISEASE 06/21/2006  . DEGENERATIVE DISC DISEASE 06/21/2006  . INCONTINENCE, URGE 06/21/2006  . PNEUMONIA, HX OF 06/21/2006  . BREAST MASS, HX OF 06/21/2006   Past Medical History  Diagnosis Date  . Constipation     chronic  . Asthma   . Heart murmur   . Scoliosis     severe, affects her digestion, GI function- chronic pain  . AR (allergic rhinitis)   . Palpitations   . Hyperlipidemia   . Hypertension   . Pneumonia     history of  . DDD (degenerative disc disease)     with injections  . Osteoarthritis     s/p right hip replacement  . Renal insufficiency   . CAD (coronary artery disease)     non obstructive. Cath 2008 with medical treatment   Past Surgical History  Procedure Laterality Date  . Abdominal hysterectomy    . Foot surgery    . Carpal tunnel release      left  . Back surgery  1999    herniated disk  . Colon surgery    . Hernia repair  2012    left inquinal, complicated by large seroma  . Tonsillectomy    . Appendectomy    . Hemorrhoid surgery    .  Coronary angioplasty  2008    LAD 40%LCX okRCA 30% EF 60%  . Bladder tack    . Total hip arthroplasty  2009  . Cataract extraction  2010  . Abdominal surgery      benign fatty tumors, also took bilateral ovaries and gallbladder   History  Substance Use Topics  . Smoking status: Never Smoker   . Smokeless tobacco: Never Used  . Alcohol Use: Yes     Comment: Very rare   Family History  Problem Relation Age of Onset  . Hypertension Mother   . Kidney disease Mother   . Heart disease Father     MI  . Alzheimer's disease Brother   . Cancer Paternal Uncle     esophageal   Allergies  Allergen Reactions  . Aspirin   . Celebrex [Celecoxib] Nausea Only  . Detrol [Tolterodine Tartrate]     REACTION: Not effective  . Rosuvastatin     REACTION: Severe leg edema  . Simvastatin     REACTION: Feet  burn   Current Outpatient Prescriptions on File Prior to Visit  Medication Sig Dispense Refill  . acetaminophen (TYLENOL) 325 MG tablet Take 650 mg by mouth as needed.        . Aloe Vera 470 MG CAPS Take by mouth daily.        Marland Kitchen amLODipine-benazepril (LOTREL) 10-40 MG per capsule take 1 capsule by mouth daily  90 capsule  1  . Bisacodyl (LAXATIVE PO) Take by mouth. Smooth Move  One packet  By mouth at bedtime for laxative.       . Cholecalciferol (VITAMIN D) 2000 UNITS tablet Take 2,000 Units by mouth daily.        . Cyanocobalamin (VITAMIN B 12 PO) Take by mouth.        . furosemide (LASIX) 20 MG tablet Take one pill by mouth once weekly to help with leg swelling  30 tablet  0  . Ginkgo Biloba 120 MG CAPS Take by mouth as needed.        Marland Kitchen MAGNESIUM ASPARTATE PO Take 1 tablet by mouth daily.        . NON FORMULARY Support stockings 15-20 mm Hg To the thigh For varicose veins  Wear daily and as needed      . polyethylene glycol (MIRALAX) packet Take 17 g by mouth as needed.        . potassium chloride SA (K-DUR,KLOR-CON) 20 MEQ tablet 1/2 pill by mouth once daily  45 tablet  3  . trimethoprim (TRIMPEX) 100 MG tablet Take 100 mg by mouth daily.      . vitamin E (VITAMIN E) 1000 UNIT capsule Take 1,000 Units by mouth daily.       No current facility-administered medications on file prior to visit.      Review of Systems Review of Systems  Constitutional: Negative for fever, appetite change, fatigue and unexpected weight change.  Eyes: Negative for pain and visual disturbance.  Respiratory: Negative for cough and shortness of breath.   Cardiovascular: Negative for cp or palpitations    Gastrointestinal: Negative for nausea, diarrhea and constipation.  Genitourinary: Negative for urgency and frequency.  Skin: Negative for pallor or rash   MSK pos for back pain chronic and OA in other joints  Neurological: Negative for weakness, light-headedness, numbness and headaches.  Hematological:  Negative for adenopathy. Does not bruise/bleed easily.  Psychiatric/Behavioral: Negative for dysphoric mood. The patient is not nervous/anxious.  Objective:   Physical Exam  Constitutional: She appears well-nourished. No distress.  Frail appearing elderly female in no distress   HENT:  Head: Normocephalic and atraumatic.  Right Ear: External ear normal.  Left Ear: External ear normal.  Nose: Nose normal.  Mouth/Throat: Oropharynx is clear and moist.  Eyes: Conjunctivae and EOM are normal. Pupils are equal, round, and reactive to light. Right eye exhibits no discharge. Left eye exhibits no discharge. No scleral icterus.  Neck: Normal range of motion. Neck supple. No JVD present. Carotid bruit is not present. No thyromegaly present.  Cardiovascular: Normal rate, regular rhythm, normal heart sounds and intact distal pulses.  Exam reveals no gallop.   Pulmonary/Chest: Effort normal and breath sounds normal. No respiratory distress. She has no wheezes. She has no rales.  Abdominal: Soft. Bowel sounds are normal. She exhibits no distension, no abdominal bruit and no mass. There is no tenderness.  Musculoskeletal: She exhibits tenderness. She exhibits no edema.  Severe scoliosis with some muscular tenderness Slow gait with cane  Lymphadenopathy:    She has no cervical adenopathy.  Neurological: She has normal reflexes. No cranial nerve deficit. She exhibits normal muscle tone. Coordination normal.  Skin: Skin is warm and dry. No rash noted. No erythema. No pallor.  Psychiatric: She has a normal mood and affect.          Assessment & Plan:

## 2012-09-14 NOTE — Patient Instructions (Addendum)
Labs today Stay as active and social as you can be  Use your walker when you need it  Take care of yourself  Get a flu shot this fall

## 2012-09-15 ENCOUNTER — Encounter: Payer: Self-pay | Admitting: *Deleted

## 2012-09-15 ENCOUNTER — Other Ambulatory Visit: Payer: Medicare Other

## 2012-09-15 LAB — LDL CHOLESTEROL, DIRECT: Direct LDL: 157.6 mg/dL

## 2012-09-15 LAB — VITAMIN D 25 HYDROXY (VIT D DEFICIENCY, FRACTURES): Vit D, 25-Hydroxy: 42 ng/mL (ref 30–89)

## 2012-09-15 NOTE — Assessment & Plan Note (Addendum)
bp in fair control at this time  No changes needed  Disc lifstyle change with low sodium diet and exercise  Lab today 

## 2012-09-15 NOTE — Assessment & Plan Note (Signed)
Lipid today  Intol statins Disc low sat fat diet

## 2012-09-15 NOTE — Assessment & Plan Note (Signed)
Lab today Disc imp for bone and overall health 

## 2012-09-15 NOTE — Assessment & Plan Note (Signed)
Lab today  Diet fair and stable No symptoms

## 2012-12-18 ENCOUNTER — Inpatient Hospital Stay: Payer: Self-pay | Admitting: Nurse Practitioner

## 2012-12-18 LAB — COMPREHENSIVE METABOLIC PANEL
Albumin: 3.4 g/dL (ref 3.4–5.0)
Alkaline Phosphatase: 124 U/L (ref 50–136)
Anion Gap: 8 (ref 7–16)
Bilirubin,Total: 1.6 mg/dL — ABNORMAL HIGH (ref 0.2–1.0)
Chloride: 100 mmol/L (ref 98–107)
EGFR (African American): 41 — ABNORMAL LOW
EGFR (Non-African Amer.): 36 — ABNORMAL LOW
Osmolality: 274 (ref 275–301)
Sodium: 133 mmol/L — ABNORMAL LOW (ref 136–145)
Total Protein: 6.6 g/dL (ref 6.4–8.2)

## 2012-12-18 LAB — APTT: Activated PTT: 51 secs — ABNORMAL HIGH (ref 23.6–35.9)

## 2012-12-18 LAB — CBC
HCT: 41.9 % (ref 35.0–47.0)
MCH: 31 pg (ref 26.0–34.0)
MCHC: 33.4 g/dL (ref 32.0–36.0)
MCV: 93 fL (ref 80–100)
Platelet: 254 10*3/uL (ref 150–440)
RBC: 4.51 10*6/uL (ref 3.80–5.20)

## 2012-12-18 LAB — CK-MB
CK-MB: 34.4 ng/mL — ABNORMAL HIGH (ref 0.5–3.6)
CK-MB: 31.8 ng/mL — ABNORMAL HIGH (ref 0.5–3.6)

## 2012-12-18 LAB — PRO B NATRIURETIC PEPTIDE: B-Type Natriuretic Peptide: 37938 pg/mL — ABNORMAL HIGH (ref 0–450)

## 2012-12-18 LAB — PROTIME-INR
INR: 1
Prothrombin Time: 13.6 secs (ref 11.5–14.7)

## 2012-12-18 LAB — TSH: Thyroid Stimulating Horm: 0.026 u[IU]/mL — ABNORMAL LOW

## 2012-12-18 LAB — TROPONIN I
Troponin-I: 12 ng/mL — ABNORMAL HIGH
Troponin-I: 14 ng/mL — ABNORMAL HIGH

## 2012-12-19 LAB — APTT
Activated PTT: 57.8 secs — ABNORMAL HIGH (ref 23.6–35.9)
Activated PTT: 54.1 secs — ABNORMAL HIGH (ref 23.6–35.9)

## 2012-12-19 LAB — LIPID PANEL
Cholesterol: 158 mg/dL (ref 0–200)
Triglycerides: 66 mg/dL (ref 0–200)
VLDL Cholesterol, Calc: 13 mg/dL (ref 5–40)

## 2012-12-19 LAB — CBC WITH DIFFERENTIAL/PLATELET
Basophil #: 0 10*3/uL (ref 0.0–0.1)
Basophil %: 0.3 %
Eosinophil #: 0.1 10*3/uL (ref 0.0–0.7)
Eosinophil %: 0.9 %
Lymphocyte #: 0.9 10*3/uL — ABNORMAL LOW (ref 1.0–3.6)
Lymphocyte %: 11.5 %
MCHC: 33.9 g/dL (ref 32.0–36.0)
MCV: 93 fL (ref 80–100)
Neutrophil #: 6 10*3/uL (ref 1.4–6.5)
Neutrophil %: 78.6 %
Platelet: 219 10*3/uL (ref 150–440)
RBC: 4.19 10*6/uL (ref 3.80–5.20)
WBC: 7.6 10*3/uL (ref 3.6–11.0)

## 2012-12-19 LAB — BASIC METABOLIC PANEL
Anion Gap: 6 — ABNORMAL LOW (ref 7–16)
BUN: 33 mg/dL — ABNORMAL HIGH (ref 7–18)
Calcium, Total: 8.8 mg/dL (ref 8.5–10.1)
Creatinine: 1.49 mg/dL — ABNORMAL HIGH (ref 0.60–1.30)
EGFR (Non-African Amer.): 31 — ABNORMAL LOW
Glucose: 113 mg/dL — ABNORMAL HIGH (ref 65–99)
Potassium: 4.4 mmol/L (ref 3.5–5.1)

## 2012-12-19 LAB — T4, FREE: Free Thyroxine: 1.4 ng/dL (ref 0.76–1.46)

## 2012-12-19 LAB — TROPONIN I: Troponin-I: 21 ng/mL — ABNORMAL HIGH

## 2012-12-20 LAB — CBC WITH DIFFERENTIAL/PLATELET
Basophil #: 0 10*3/uL (ref 0.0–0.1)
Basophil %: 0.6 %
Eosinophil #: 0.1 10*3/uL (ref 0.0–0.7)
HCT: 36.8 % (ref 35.0–47.0)
HGB: 12.6 g/dL (ref 12.0–16.0)
MCH: 31.6 pg (ref 26.0–34.0)
MCHC: 34.2 g/dL (ref 32.0–36.0)
Neutrophil #: 6.7 10*3/uL — ABNORMAL HIGH (ref 1.4–6.5)
RDW: 13.2 % (ref 11.5–14.5)

## 2012-12-20 LAB — BASIC METABOLIC PANEL
Anion Gap: 10 (ref 7–16)
Calcium, Total: 8 mg/dL — ABNORMAL LOW (ref 8.5–10.1)
Co2: 25 mmol/L (ref 21–32)
Creatinine: 1.4 mg/dL — ABNORMAL HIGH (ref 0.60–1.30)
EGFR (African American): 38 — ABNORMAL LOW
Glucose: 93 mg/dL (ref 65–99)
Osmolality: 272 (ref 275–301)
Potassium: 4 mmol/L (ref 3.5–5.1)
Sodium: 132 mmol/L — ABNORMAL LOW (ref 136–145)

## 2012-12-20 LAB — CK-MB: CK-MB: 16.4 ng/mL — ABNORMAL HIGH (ref 0.5–3.6)

## 2012-12-21 ENCOUNTER — Ambulatory Visit: Payer: Self-pay | Admitting: Internal Medicine

## 2012-12-21 DIAGNOSIS — R0602 Shortness of breath: Secondary | ICD-10-CM

## 2012-12-21 LAB — CBC WITH DIFFERENTIAL/PLATELET
Basophil #: 0 10*3/uL (ref 0.0–0.1)
Basophil %: 0.5 %
Eosinophil %: 0.9 %
HCT: 35.6 % (ref 35.0–47.0)
HGB: 12.3 g/dL (ref 12.0–16.0)
Lymphocyte #: 0.4 10*3/uL — ABNORMAL LOW (ref 1.0–3.6)
Lymphocyte %: 4.2 %
MCH: 31.7 pg (ref 26.0–34.0)
MCHC: 34.5 g/dL (ref 32.0–36.0)
MCV: 92 fL (ref 80–100)
Monocyte #: 0.9 x10 3/mm (ref 0.2–0.9)
Monocyte %: 8.1 %
Neutrophil #: 9.2 10*3/uL — ABNORMAL HIGH (ref 1.4–6.5)
RDW: 13.3 % (ref 11.5–14.5)
WBC: 10.6 10*3/uL (ref 3.6–11.0)

## 2012-12-21 LAB — BASIC METABOLIC PANEL
Anion Gap: 8 (ref 7–16)
BUN: 31 mg/dL — ABNORMAL HIGH (ref 7–18)
Creatinine: 1.26 mg/dL (ref 0.60–1.30)
EGFR (African American): 43 — ABNORMAL LOW
Glucose: 107 mg/dL — ABNORMAL HIGH (ref 65–99)
Osmolality: 262 (ref 275–301)
Sodium: 127 mmol/L — ABNORMAL LOW (ref 136–145)

## 2012-12-21 LAB — URINALYSIS, COMPLETE
Bilirubin,UR: NEGATIVE
Leukocyte Esterase: NEGATIVE
Ph: 5 (ref 4.5–8.0)
Specific Gravity: 1.016 (ref 1.003–1.030)
Squamous Epithelial: 1
WBC UR: 40 /HPF (ref 0–5)

## 2012-12-22 LAB — BASIC METABOLIC PANEL
Anion Gap: 6 — ABNORMAL LOW (ref 7–16)
BUN: 27 mg/dL — ABNORMAL HIGH (ref 7–18)
Chloride: 88 mmol/L — ABNORMAL LOW (ref 98–107)
Creatinine: 1.06 mg/dL (ref 0.60–1.30)
EGFR (Non-African Amer.): 46 — ABNORMAL LOW
Glucose: 126 mg/dL — ABNORMAL HIGH (ref 65–99)
Osmolality: 251 (ref 275–301)

## 2012-12-22 LAB — PROTEIN / CREATININE RATIO, URINE
Creatinine, Urine: 60.7 mg/dL (ref 30.0–125.0)
Protein, Random Urine: 12 mg/dL (ref 0–12)
Protein/Creat. Ratio: 198 mg/gCREAT (ref 0–200)

## 2012-12-22 LAB — URINALYSIS, COMPLETE
Bilirubin,UR: NEGATIVE
Blood: NEGATIVE
Hyaline Cast: 10
Leukocyte Esterase: NEGATIVE
Nitrite: NEGATIVE
Ph: 5 (ref 4.5–8.0)
RBC,UR: 1 /HPF (ref 0–5)
Specific Gravity: 1.018 (ref 1.003–1.030)
Squamous Epithelial: 3
WBC UR: 2 /HPF (ref 0–5)

## 2012-12-22 LAB — SODIUM, URINE, RANDOM: Sodium, Urine Random: 20 mmol/L (ref 20–110)

## 2012-12-22 LAB — CHLORIDE, URINE, RANDOM: Chloride, Urine Random: 34 mmol/L — ABNORMAL LOW (ref 55–125)

## 2012-12-23 LAB — HEMOGLOBIN: HGB: 11.4 g/dL — ABNORMAL LOW (ref 12.0–16.0)

## 2012-12-23 LAB — BASIC METABOLIC PANEL
Anion Gap: 6 — ABNORMAL LOW (ref 7–16)
BUN: 31 mg/dL — ABNORMAL HIGH (ref 7–18)
Calcium, Total: 8.2 mg/dL — ABNORMAL LOW (ref 8.5–10.1)
Chloride: 86 mmol/L — ABNORMAL LOW (ref 98–107)
EGFR (African American): 43 — ABNORMAL LOW
EGFR (Non-African Amer.): 37 — ABNORMAL LOW
Glucose: 107 mg/dL — ABNORMAL HIGH (ref 65–99)
Osmolality: 249 (ref 275–301)
Sodium: 120 mmol/L — CL (ref 136–145)

## 2012-12-24 LAB — BASIC METABOLIC PANEL
Anion Gap: 6 — ABNORMAL LOW (ref 7–16)
BUN: 33 mg/dL — ABNORMAL HIGH (ref 7–18)
Calcium, Total: 8.5 mg/dL (ref 8.5–10.1)
Chloride: 84 mmol/L — ABNORMAL LOW (ref 98–107)
Creatinine: 1.44 mg/dL — ABNORMAL HIGH (ref 0.60–1.30)
EGFR (African American): 37 — ABNORMAL LOW
EGFR (Non-African Amer.): 32 — ABNORMAL LOW
Osmolality: 244 (ref 275–301)
Potassium: 4.9 mmol/L (ref 3.5–5.1)

## 2012-12-25 LAB — BASIC METABOLIC PANEL
Anion Gap: 7 (ref 7–16)
BUN: 44 mg/dL — ABNORMAL HIGH (ref 7–18)
Calcium, Total: 8.8 mg/dL (ref 8.5–10.1)
Chloride: 82 mmol/L — ABNORMAL LOW (ref 98–107)
Co2: 26 mmol/L (ref 21–32)
Creatinine: 2.42 mg/dL — ABNORMAL HIGH (ref 0.60–1.30)
EGFR (African American): 20 — ABNORMAL LOW
EGFR (Non-African Amer.): 17 — ABNORMAL LOW
Potassium: 5.2 mmol/L — ABNORMAL HIGH (ref 3.5–5.1)
Sodium: 115 mmol/L — CL (ref 136–145)

## 2012-12-26 LAB — BASIC METABOLIC PANEL
BUN: 55 mg/dL — ABNORMAL HIGH (ref 7–18)
Calcium, Total: 8.5 mg/dL (ref 8.5–10.1)
Chloride: 84 mmol/L — ABNORMAL LOW (ref 98–107)
Co2: 25 mmol/L (ref 21–32)
EGFR (African American): 12 — ABNORMAL LOW
EGFR (Non-African Amer.): 11 — ABNORMAL LOW
Glucose: 101 mg/dL — ABNORMAL HIGH (ref 65–99)
Osmolality: 250 (ref 275–301)
Potassium: 5.8 mmol/L — ABNORMAL HIGH (ref 3.5–5.1)
Sodium: 116 mmol/L — CL (ref 136–145)

## 2013-01-02 ENCOUNTER — Ambulatory Visit: Payer: Self-pay | Admitting: Internal Medicine

## 2013-01-02 ENCOUNTER — Ambulatory Visit
Admit: 2013-01-02 | Disposition: A | Discharge status: Home / Self Care | Payer: Self-pay | Attending: Nurse Practitioner | Admitting: Nurse Practitioner

## 2013-02-19 ENCOUNTER — Other Ambulatory Visit: Payer: Self-pay | Admitting: Internal Medicine

## 2013-02-19 LAB — PROTIME-INR
INR: 2.9
Prothrombin Time: 29.2 secs — ABNORMAL HIGH (ref 11.5–14.7)

## 2013-03-29 ENCOUNTER — Ambulatory Visit: Payer: Self-pay | Admitting: Specialist

## 2013-06-26 ENCOUNTER — Other Ambulatory Visit: Payer: Self-pay | Admitting: Internal Medicine

## 2013-06-26 LAB — CBC WITH DIFFERENTIAL/PLATELET
BASOS ABS: 0 10*3/uL (ref 0.0–0.1)
Basophil %: 0.9 %
Eosinophil #: 0.2 10*3/uL (ref 0.0–0.7)
Eosinophil %: 5.5 %
HCT: 33.1 % — ABNORMAL LOW (ref 35.0–47.0)
HGB: 11 g/dL — ABNORMAL LOW (ref 12.0–16.0)
LYMPHS PCT: 29.9 %
Lymphocyte #: 1.3 10*3/uL (ref 1.0–3.6)
MCH: 29 pg (ref 26.0–34.0)
MCHC: 33.2 g/dL (ref 32.0–36.0)
MCV: 87 fL (ref 80–100)
MONOS PCT: 9.5 %
Monocyte #: 0.4 x10 3/mm (ref 0.2–0.9)
NEUTROS PCT: 54.2 %
Neutrophil #: 2.3 10*3/uL (ref 1.4–6.5)
Platelet: 219 10*3/uL (ref 150–440)
RBC: 3.78 10*6/uL — ABNORMAL LOW (ref 3.80–5.20)
RDW: 17.7 % — AB (ref 11.5–14.5)
WBC: 4.2 10*3/uL (ref 3.6–11.0)

## 2013-06-26 LAB — BASIC METABOLIC PANEL
ANION GAP: 10 (ref 7–16)
BUN: 92 mg/dL — AB (ref 7–18)
Calcium, Total: 8.8 mg/dL (ref 8.5–10.1)
Chloride: 92 mmol/L — ABNORMAL LOW (ref 98–107)
Co2: 30 mmol/L (ref 21–32)
Creatinine: 1.67 mg/dL — ABNORMAL HIGH (ref 0.60–1.30)
EGFR (Non-African Amer.): 27 — ABNORMAL LOW
GFR CALC AF AMER: 31 — AB
Glucose: 113 mg/dL — ABNORMAL HIGH (ref 65–99)
OSMOLALITY: 294 (ref 275–301)
Potassium: 3.3 mmol/L — ABNORMAL LOW (ref 3.5–5.1)
SODIUM: 132 mmol/L — AB (ref 136–145)

## 2013-09-02 ENCOUNTER — Ambulatory Visit
Admit: 2013-09-02 | Disposition: A | Discharge status: Home / Self Care | Payer: Self-pay | Attending: Nurse Practitioner | Admitting: Nurse Practitioner

## 2013-12-03 ENCOUNTER — Ambulatory Visit
Admit: 2013-12-03 | Disposition: A | Discharge status: Home / Self Care | Payer: Self-pay | Attending: Nurse Practitioner | Admitting: Nurse Practitioner

## 2014-03-25 ENCOUNTER — Observation Stay: Payer: Self-pay | Admitting: Internal Medicine

## 2014-04-04 ENCOUNTER — Ambulatory Visit: Payer: Self-pay | Admitting: Family Medicine

## 2014-05-25 NOTE — Consult Note (Signed)
CC: vomiting, epigastric abd pain, MI.  Pt has scoliosis and has chronic epigastric pain and constipation.  First bowel movement of admission was today. Because of MI   No EGD or barium studies planned.  Will drop diet back to full liquid at her request, difficulty swallowing meats, etc, may be hiatal hernia or spasm.    Electronic Signatures: Manya Silvas (MD)  (Signed on 20-Nov-14 18:19)  Authored  Last Updated: 20-Nov-14 18:19 by Manya Silvas (MD)

## 2014-05-25 NOTE — Consult Note (Signed)
Patient had stent in mid LAD and needs to be placed back on asp/plavix  Electronic Signatures: Angelica Ran (MD)  (Signed on 21-Nov-14 09:02)  Authored  Last Updated: 21-Nov-14 09:02 by Angelica Ran (MD)

## 2014-05-25 NOTE — Consult Note (Signed)
PATIENT NAME:  [name], [name] MR#:  [id number] DATE OF BIRTH:  [birth year]  DATE OF CONSULTATION:  12/18/2012  REFERRING PHYSICIAN:   CONSULTING PHYSICIAN:  Dionisio David, MD  INDICATION FOR CONSULTATION: Chest pain and non-STEMI.   HISTORY OF PRESENT ILLNESS:  This is a 81 year old white female with a past medical history of hypertension, hyperlipidemia and scoliosis, who came into the hospital with chest pain since last night. She has been having retrosternal pressure-type chest pain associated with shortness of breath, nausea and vomiting, but she decided to come very late now at 3:00 p.m. She has been having chest pain since 9:00 Saturday night. She says she had chest pain 3 weeks ago, lasting only a few minutes. She has been short of breath for a few weeks on minimal exertion. The chest pain started last night around 9:00 p.m. She was reluctant to come to the hospital so her 2 daughters finally brought her to the hospital. She is denying any chest pain right now.   PAST MEDICAL HISTORY: History of hypertension. History of hyperlipidemia. Scoliosis.   SOCIAL HISTORY: No history of EtOH abuse or smoking.   ALLERGIES: No allergies to dye or any medications.   FAMILY HISTORY: Positive for coronary artery disease. Her father and her sisters had coronary artery disease. She had a cardiac catheterization 5 years ago and it was negative in [place].   PHYSICAL EXAMINATION: GENERAL: She is alert, oriented x 3.  VITAL SIGNS:  Heart rate is 105, respirations . She is afebrile. Blood pressure 130/70, respirations cheyne stoke.  NECK: Positive JVD.   LUNGS: Good air entry. No rales or rhonchi.  HEART: Regular rate with occasional ectopy. No audible murmur.  ABDOMEN: Soft, nontender with positive bowel sounds.  EXTREMITIES: There is trace pedal edema.   LABORATORY, DIAGNOSTIC AND RADIOLOGICAL DATA: EKG shows normal sinus rhythm with frequent PVCs, poor R wave progression suggestive of recent  anteroseptal wall MI, there is nonspecific ST-T changes. CPK-MB is 34.4. Her INR is 13.6. Her chest x-ray shows congestion suggestive of heart failure. Her BUN and creatinine are 28/1.31, her glucose is 141, her GFR is 41. Her troponin was 12.0. Her BNP is 37,938. White count 11.8, hemoglobin is 14.   ASSESSMENT AND PLAN: The patient is in congestive heart failure on chest x-ray. She is having non-ST-elevation myocardial infarction, has EKG changes which are suggestive of probable anteroseptal wall myocardial infarction. She is not having chest pain right now. We will start the patient on Integrilin and schedule cardiac catheterization. There was a lengthy discussion with the patient as well as her 2 daughters about cardiac catheterization. Even though she is 77, she has been quite functional and was able to take care of herself, cook and do all the activities that she likes. She has never been really sick except for of for having osteoporosis and scoliosis and back pain. She never really been sick. Her daughters want everything done. Thus, we will proceed with catheterization. She does bruise easily from medications that she has been given in the past so we will be careful with giving Integrilin, but we will start the Integrilin because her troponin is very high.    ____________________________ Dionisio David, MD sak:cs D: 12/18/2012 15:05:37 ET T: 12/18/2012 15:16:30 ET JOB#: [id number]  cc: Dionisio David, MD, <Dictator> Dionisio David MD ELECTRONICALLY SIGNED 01/09/2013 8:33

## 2014-05-25 NOTE — H&P (Signed)
PATIENT NAME:  [name], [name] MR#:  [id number] DATE OF BIRTH:  08-Mar-1920  DATE OF ADMISSION:  12/18/2012  PRIMARY CARE PHYSICIAN: Marne A. Tower, MD  CHIEF COMPLAINT:  Epigastric pain and shortness of breath.   HISTORY OF PRESENT ILLNESS: This is a 81 year old female with a past medical history of hypertension, irregular heartbeat who presents with the above complaint. The patient says that over the past month she has been having on and off epigastric pain. She did not think much of it, however, over the past week, she had 3 episodes of severe epigastric pain associated with shortness of breath and today it was at its worse at 10 out of 10 so she came here for further evaluation. In the ER, blood work was performed which showed an elevated troponin of 12. She has some anterior EKG changes as well. She is currently not having any discomfort. She is also complaining of shortness of breath and she does say that she has had some lower extremity edema but denies any weight gain.   REVIEW OF SYSTEMS: CONSTITUTIONAL: No fever. Positive fatigue, weakness.  EYES:  No blurred or double vision.   ENT:  No ear pain, hearing loss.  No snoring, postnasal drip.  RESPIRATORY:  No cough, wheezing, hemoptysis or COPD.   CARDIOVASCULAR:  No chest pain. She had epigastric pain. No palpitations, orthopnea, syncope, arrhythmia or dyspnea on exertion.  GASTROINTESTINAL: No nausea, vomiting, diarrhea. Positive epigastric pain. No hematemesis, no melena or ulcers, GERD.  GENITOURINARY:  No dysuria or hematuria.   ENDOCRINE:  No polyuria or polydipsia. HEMATOLOGY AND LYMPHATIC:  No anemia or easy bruising.   SKIN:  No rash or lesions.   MUSCULOSKELETAL:  She has scoliosis, occasionally she has some back pain.  NEUROLOGICAL:  No history of CVA, TIA or seizures.   PSYCHIATRIC: She does have anxiety.   PAST MEDICAL HISTORY: 1.  Hypertension.  2.  Irregular heartbeat.  3.  Scoliosis.   MEDICATIONS:  1. Vitamin D3  daily.  2.  Vitamin B12 daily.  3.  Lotril 10/40 daily 4.  Multivitamin 1 tablet daily.  5.  Magnesium oxide 1 tablet daily.  6.  Lasix 20 mg daily PRN 7.  Fish oil 1 tablet every other day 8.  OTC potassium    SURGERIES:  1.  Hysterectomy.  2.  Appendectomy.  3.  Hip replacement.  4.  Hernia surgery.  5.  She also says she has had cholecystectomy.   SOCIAL HISTORY:  No tobacco, alcohol or drug use. She lives with her daughter.   ALLERGIES:  CELEBREX AND ASPIRIN WHICH CAUSES NAUSEA.   FAMILY HISTORY: None.    PHYSICAL EXAMINATION: VITAL SIGNS: Temperature 98.3, pulse 82, respirations 20, blood pressure 143/71, 96% on room air.  GENERAL: The patient is alert, oriented, not in acute distress.  HEENT:  Head is atraumatic. Pupils are round, sclerae are anicteric. Mucous membranes are moist. Oropharynx is clear.   NECK: Supple without JVD, carotid bruit or enlarged thyroid.  CARDIOVASCULAR: Irregularly irregular with a 2 out of 6 systolic ejection murmur heard best at the left sternal border. PMI is slightly laterally displaced. No rubs or gallops.  LUNGS: Clear to auscultation with the exception of the bases which have bilateral crackles. No egophony or tactile fremitus. Normal chest expansion.  ABDOMEN: Bowel sounds are positive. Nontender, nondistended. No hepatosplenomegaly.  EXTREMITIES: She has no edema, cyanosis or clubbing.  NEURO:  Cranial nerves II through XII are grossly intact, no focal  deficits.  MUSCULOSKELETAL: No pathology to digits. The patient is able to move all extremities.  SKIN:  Without rash or lesions.   LABORATORY, DIAGNOSTIC AND RADIOLOGICAL DATA:  Troponin 12. White blood cells 11.8, hemoglobin 14, hematocrit 42, platelets 254. Sodium 133, potassium 4.0, chloride 100, bicarb 25, BUN 28, creatinine 1.31, glucose 141, calcium 9.0, bilirubin 1.6, alk phos 124, ALT 32, AST 86, total protein 6.6, albumin 3.4. CPK-MB is 34.4. INR is 1.0, BNP is 37,938. Chest x-ray  shows some pulmonary edema. EKG shows some T wave in the anterior leads.   ASSESSMENT AND PLAN:  A 81 year old female who presented with epigastric pain and found to have a non-ST elevation myocardial infarction.  1.  Non-ST-elevation myocardial infarction.  Dr. Neoma Laming has been consulted regarding her non-ST elevation myocardial infarction.  For now, would do medical management including metoprolol, aspirin and statin.  I have ordered echocardiogram. Continue to cycle cardiac enzymes. The patient is on a heparin drip. It should be noted that the patient allergies is nausea so we will monitor her but she should be fine on aspirin and I have written for some p.r.n. nausea medications.   2.  Acute congestive heart failure, likely systolic. Will order an echocardiogram and have written for Lasix. Will follow Is and Os and daily weights. Suspect her congestive heart failure could be due to her non-ST elevation myocardial infarction.  3.  Elevated AST and total bilirubin, likely secondary to her non-ST elevation myocardial infarction.  4.  Hyponatremia, very mild. We will order a TSH but this could be  secondary to her congestive heart failure.  5.  Hypertension. The patient is on metoprolol. She is not on any medications at home for blood pressure. Will follow while in the hospital and titrate medications as tolerated.     The patient is a limited CODE STATUS. No intubation but okay to resuscitate.   TIME SPENT: Approximately 50 minutes.      ____________________________ Donell Beers. Benjie Karvonen, MD spm:cs D: 12/18/2012 14:48:00 ET T: 12/18/2012 15:26:41 ET JOB#: [id number]  cc: Amaurie Schreckengost P. Benjie Karvonen, MD, <Dictator> Marne A. Glori Bickers, MD Ulice Bold P Abeer Deskins MD ELECTRONICALLY SIGNED 12/18/2012 18:32

## 2014-05-25 NOTE — Consult Note (Signed)
PATIENT NAME:  [name], [name] MR#:  [id number] DATE OF BIRTH:  1921/01/24  DATE OF CONSULTATION:  12/22/2012  REFERRING PHYSICIAN:  Dr. Bridgette Habermann. CONSULTING PHYSICIAN:  Joelene Millin A. Jerelene Redden, ANP (Adult Nurse Practitioner), Gaylyn Cheers, M.D.  REASON FOR CONSULTATION: Coffee-ground emesis.   HISTORY OF PRESENT ILLNESS:  This 81 year old patient of Dr. Loura Pardon was admitted to the hospital on 12/18/2012, for epigastric pain and shortness of breath. She has reported epigastric pain intermittent for several months. The pain was rated severe, and EKG and blood work did show that she had a non-ST elevation myocardial infarction. She was seen by cardiology and underwent a cardiac catheterization that found a 99% mid LAD that was treated with PCA stent. The patient has been placed on heparin, as well as Integrilin drip. She did develop hematuria. Her pro time has been 13.6, INR 1.0 and PTT as high as 57.8. The patient did demonstrate emesis last night that was dark brown. She has had intermittent nausea. The patient has had problems eating for several months. She reports epigastric pain and points to the xiphoid area and states that food seems to hang in this region. The patient reports this is a chronic problem and has been evaluated in the past with EGD. She cannot recall the results. She thinks she may have been dilated at the past. The patient reports her weight has been stable. She reports prior colonoscopies done in [place], maybe when she was in her 14s for polypectomy. Bowel movements have been a little constipated during this recent period.   Currently, the patient is drinking Ensure, still has epigastric discomfort, not as severe. Has a wave of nausea on and off. Vomited x 1 last night, and the patient thinks that is the only time she has vomited during this admission. The patient reports her chest feels well. She denies shortness of breath, and believes she is getting along well. She does take  chronic Tylenol Arthritis, and denies any NSAID use.   PAST MEDICAL HISTORY: 1.  Hypertension.  2.  Arrhythmia.  3.  Scoliosis.  4.  History of hiatal hernia.   PAST SURGICAL HISTORY:  Cataract surgery, appendectomy, hysterectomy, hernia surgery, right hip replacement, back surgery.   MEDICATIONS ON ADMISSION:   1.  Vitamin D3, vitamin B12, multivitamin daily, magnesium oxide 1 daily, fish oil 1 tablet every other day.  2.  Potassium,  3.  Tylenol Arthritis as needed.  4.  Lotrel 10/40 daily.  5.  Lasix 20 mg daily.   ALLERGIES: CELEBREX AND ASPIRIN, WHICH CAUSES NAUSEA.   HABITS: Negative tobacco or alcohol. The patient lives with her daughter.   FAMILY HISTORY: Negative for colon cancer.   REVIEW OF SYSTEMS:  Ten systems reviewed, negative with the exception as noted in the history of present illness.   PHYSICAL EXAMINATION: GENERAL:  Elderly Caucasian female sitting up in bed. Spry for her age.  HEENT: Head is normocephalic. Conjunctivae pink. Sclerae anicteric. Oral mucosa is moist and intact.  NECK: Supple. Trachea midline.  CARDIAC: S1, S2 present. Irregular heart rate noted.  LUNGS: Have scattered crackles, a few scattered rhonchi. Respirations are nonlabored, but she is using oxygen and becomes a little breathy with talking.  ABDOMEN: Soft, slightly protuberant, tender epigastric area. Bowel sounds are present. No hepatosplenomegaly or masses.  EXTREMITIES: Lower extremities without edema or cyanosis.  SKIN: Warm and dry with excessive bruising on bilateral arms, bruising around the right eyelid  where she had scratched. No rash noted.  MUSCULOSKELETAL: Without joint swelling, inflammation. Gait not evaluated.  PSYCHIATRIC:  Affect and mood within normal. Very pleasant, appears relaxed.  NEUROLOGIC: Cranial nerves grossly intact. The patient has strength equal bilateral and is able to move herself around in the bed without assistance.   LABORATORY DATA: Admission blood  work notable for BNP of 37,938, BUN 28, creatinine 1.31, sodium 133, CPK-MB 34, troponin 12, peaked at 21, now down to 6. TSH 0.026.  WBC 11.8, hemoglobin 14.0. Pro time baseline 13.6, INR 1.0, PTT 32.9. With anticoagulation, PTT up to 54.1 with last check 12/19/2012. Urinalysis had shown blood. Subsequent laboratory studies today include a BUN of 27, creatinine 1.06, sodium 121 with adjustment and IV fluids. Her hemoglobin is 12.3.   RADIOLOGY: Chest x-ray today, single view, shows right upper extremity PICC line in place.  There is worsening pulmonary edema with now moderate left and small right-sided effusion with associated bibasilar opacities or atelectasis versus infiltrate. She had a CT angiography of the chest, abdomen and pelvis with and without contrast, 12/21/2012, that showed no evidence of aneurysm, retro or intraperitoneal hemorrhage, and there are positive lung findings as noted on the chest x-ray.   IMPRESSION: 1.  The patient presents with epigastric pain and shortness of breath and was found to have a non-ST elevation myocardial infarction. She has had subsequent cardiac catheterization  and treatment of a 99% mid LAD lesion. The patient has been placed on heparin and Integrilin drip and had elevation in her PTT as expected. She has had associated bruising. 2.  The patient presented on admission with  intermittent episodes of epigastric pain, solid food dysphagia that she reports is a chronic problem. She has had reports of a prior EGD, cannot recall whether she has ever had dilatation in the past. She avoids NSAIDs. She does not present on a proton pump inhibitor. The patient had an episode of emesis that appeared coffee-ground last night. She has had problems swallowing solid food and prefers to have liquid Ensure. She reports painful swallowing in the epigastric area and the sense that food is hanging. Etiology for this problem to consider gastritis, erosive gastritis, cannot rule out  malignancy or peptic ulcer disease. She very likely has esophageal spasm, given the advanced age and the intermittent chronic nature of her history. She could have a benign or malignant esophageal stricture. The addition of anticoagulation certainly puts her at risk for upper GI bleed. The patient's hemoglobin has remained stable.   PLAN: The patient is not a luminal candidate at this time. It would not be safe to scope her. We can consider elective barium swallow in about 3 weeks or so. In the meantime, recommend IV Protonix twice daily, Carafate slurry 3 times a day before meals. Full liquid diet only with Ensure. Zofran as needed every 4 to 6 hours to prevent vomiting. Recommend daily hemoglobin monitor for GI blood loss.    Continue to monitor the patient's course daily.  I have discussed this case in detail with Dr. Gaylyn Cheers, who is involved in the development in the patient's plan of care.   Thank you for this consultation.   These services were provided by Denice Paradise, MS, APRN, BC, ANP under collaborative agreement with Gaylyn Cheers, M.D.     ____________________________ Janalyn Harder. Jerelene Redden, ANP (Adult Nurse Practitioner) kam:dmm D: 12/22/2012 17:07:56 ET T: 12/22/2012 19:38:23 ET JOB#: [id number]  cc: Joelene Millin A. Jerelene Redden, ANP (Adult Nurse Practitioner), <Dictator> Wynelle Fanny. Glori Bickers, MD Janalyn Harder. Sherlyn Hay, MSN,  ANP-BC Adult Nurse Practitioner ELECTRONICALLY SIGNED 12/23/2012 12:02

## 2014-05-25 NOTE — Discharge Summary (Signed)
PATIENT NAME:  [name], [name] MR#:  [id number] DATE OF BIRTH:  11-18-1920  DATE OF ADMISSION:  12/18/2012 DATE OF DISCHARGE:  12/26/2012  PRIMARY CARE PHYSICIAN: Dr. Glori Bickers.   CONSULTATIONS: Cardiology, Dr. Humphrey Rolls. Palliative care, Dr. Ermalinda Memos. Nephrology, Dr. Candiss Norse and Dr. Holley Raring. GI, Dr. Vira Agar.   DISCHARGE DIAGNOSES:  1. Non-ST elevation myocardial infarction.  2. Acute systolic congestive heart failure with an ejection fraction of 32%.  3. Severe hyponatremia.   4. Acute renal failure.  5. Possible pneumonia.  6. Hypertension.  7. Bradycardia.   REASON FOR ADMISSION: Epigastric pain and shortness of breath.   HOSPITAL COURSE: The patient is a 81 year old Caucasian female with a history of hypertension, irregular heart rate. Was sent to the ED due to epigastric pain and shortness of breath. In the ED, the patient's troponin was elevated at 12. The patient also had some anterior EKG changes. For a detailed history and physical examination, please refer to the admission note dictated by Dr. Benjie Karvonen. On admission date, laboratory data showed troponin 12, WBC 11.8, hemoglobin 14, sodium 133, BUN 28, creatinine 1.31. Chest x-ray showed some pulmonary edema. BNP [id number].  1. Non-STEMI. The patient was initially treated with a heparin drip. Dr. Humphrey Rolls, cardiologist, evaluated the patient and suggested a cardiac cath which showed CAD, 99 stenosis in mid LAD, so PCI stent was placed by Dr. Clayborn Bigness who suggested starting Plavix, aspirin, Integrilin; however, since the patient has hematuria, aspirin and Plavix were discontinued.  2. Acute systolic congestive heart failure. The patient's echocardiogram showed ejection fraction 32%. The patient was treated with Lasix; however, since the patient developed severe hyponatremia (sodium decreased to 118, then down to 115), so Lasix was discontinued.  3. Bradycardia. The patient developed severe bradycardia to 30s to 40s, so Lopressor was on hold. The patient's  heart rate increased to about 50 to 60.  4. Acute renal failure, possibly due to prerenal etiology with Lasix, so as mentioned above, Lasix was on hold; however, the patient's BUN and creatinine still worsening. Last BUN was 55 and creatinine was 3.54.   5. Possible pneumonia. The patient was treated with Levaquin, the patient's shortness of breath became better; however, had nausea, vomiting and no appetite.   Generally, the patient's condition has been declining. We requested palliative care consult. The patient's family member initially decided LIMITED CODE, but since the patient has very poor prognosis, I discussed with the patient's family member and also the patient herself. They decided to give her comfortable care and go to a hospice home. I discussed the patient's discharge plan with the patient, the patient's daughter, hospice staff, case Freight forwarder and Education officer, museum and the nurse. The patient will be discharged to a hospice home today.   TIME SPENT: About 42 minutes.   ____________________________ Demetrios Loll, MD qc:gb D: 12/26/2012 15:07:48 ET T: 12/26/2012 23:30:49 ET JOB#: [id number]  cc: Demetrios Loll, MD, <Dictator> Demetrios Loll MD ELECTRONICALLY SIGNED 12/27/2012 15:35

## 2014-05-25 NOTE — Consult Note (Signed)
Pt with tachypnea but oxygen sat 99%, may be due to effusions/ atelectasis.  Pt with poor appetite due to cardiopul illness and maybe GI problems as well.  No new recommendations.  Dr. Gustavo Lah on this weekend but will not see unless called.  Electronic Signatures: Manya Silvas (MD)  (Signed on 21-Nov-14 18:24)  Authored  Last Updated: 21-Nov-14 18:24 by Manya Silvas (MD)

## 2014-06-03 NOTE — H&P (Signed)
PATIENT NAME:  [name], [name] MR#:  [id number] DATE OF BIRTH:  [birth year]  DATE OF ADMISSION:  03/25/2014  REFERRING PHYSICIAN: Larae Grooms, M.D.    FAMILY PHYSICIAN: Mikeal Hawthorne. Brynda Greathouse, M.D.   REASON FOR ADMISSION: Elevated troponin.   HISTORY OF PRESENT ILLNESS: The patient is a 81 year old female who resides at [facility], who was a DNR there, with a history of coronary artery disease status post MI and cardiac arrhythmias. Also has underlying dementia. She fell at the facility earlier today and was brought to the Emergency Room where she was noted to have a scalp laceration. While in the Emergency Room, a troponin was checked, and it was found to be mildly elevated. The patient is not complaining of chest pain or shortness of breath. She has a known history of cardiac arrhythmias and her EKG here suggests atrial fibrillation with no acute ischemic changes. Troponins x 2 in the Emergency Room were stable, and essentially unchanged, but the patient is now admitted for further observation and evaluation of her elevated troponins despite her lack of chest pain and despite her age and DNR status.   PAST MEDICAL HISTORY: 1. ASCVD status post MI.  2. Ischemic cardiomyopathy.  3. History of congestive heart failure.  4. Senile dementia.  5. Benign hypertension.  6. History of cardiac arrhythmias.  7. Hiatal hernia.  8. Status post appendectomy.  9. Status post hysterectomy.  10. Status post right hip surgery.   MEDICATIONS:  1. Zofran 4 mg p.o. q.6 h. p.r.n.  2. Roxanol 20 mg/mL 10 mg every 1 to 2 hours as needed.  3. Lorazepam 0.5 mg q.2 to 4h. as needed for anxiety. 4. Lasix 20 mg p.o. daily as needed.   ALLERGIES: ASPIRIN AND CELEBREX.   SOCIAL HISTORY: Negative for alcohol or tobacco abuse.   FAMILY HISTORY: Unable to obtain from the patient due to her sedation.   REVIEW OF SYSTEMS: Unable to obtain from the patient due to her sedation.   PHYSICAL  EXAMINATION: GENERAL: The patient is elderly, somnolent, sleeping in the bed, not arousing to verbal stimuli.  VITAL SIGNS: Remarkable for a blood pressure of 135/90, with a heart rate of 95 which is irregular, respiratory rate of 18, temperature of 98.9, saturation 94% on room air.  HEENT: Normocephalic with trauma noted to the left frontal scalp with laceration noted. Pupils are equally round and reactive to light and accommodation. Extraocular movements are intact. Sclerae are nonicteric. Conjunctivae are clear. Oropharynx is clear  NECK: Supple without JVD. No adenopathy or thyromegaly is noted.  LUNGS: Essentially clear to auscultation and percussion without wheezes, rales or rhonchi. No dullness. Respiratory effort is normal.  CARDIAC: Irregularly irregular rhythm.  ABDOMEN: Soft, nontender, with normoactive bowel sounds. No organomegaly or masses appreciated. No hernias or bruits were noted.  EXTREMITIES: Revealed stasis changes, with trace edema. Pulses were 2+ bilaterally. No clubbing or cyanosis.  NEUROLOGIC: Revealed cranial nerves II through XII grossly intact. Deep tendon reflexes were symmetric. Motor and sensory examination nonfocal.  PSYCHIATRIC: Unable to be performed due to the patient's sedation.   LABORATORY DATA:  Initial troponin was 0.04, and then increased to 0.06.   Glucose 120, with a BUN of 34, creatinine of 1.30, sodium of 138, and a GFR of 41. Her white count was 7 with a hemoglobin of 13.5.   Chest x-ray showed no active disease.   EKG reveals what appears to be atrial fibrillation with no acute ischemic changes.  ASSESSMENT:  1. Elevated troponin of unclear significance.  2. Atrial fibrillation, presumably chronic.  3. Head trauma with altered mental status.  4. Atherosclerotic cardiovascular disease. 5. Ischemic cardiomyopathy.   PLAN: The patient will be observed on telemetry as a NO CODE BLUE/DO NOT RESUSCITATE. We will follow serial cardiac enzymes and  consult cardiology. Local wound care for her scalp laceration. We will obtain a social work consult for discharge planning as the patient is from a facility. Further treatment and evaluation will depend upon the patient's progress.   TOTAL TIME SPENT ON THIS PATIENT: 45 minutes.    ____________________________ Leonie Douglas Doy Hutching, MD jds:JT D: 03/25/2014 15:09:03 ET T: 03/25/2014 15:30:45 ET JOB#: [id number]  cc: Leonie Douglas. Doy Hutching, MD, <Dictator> Mikeal Hawthorne. Brynda Greathouse, MD Jennyfer Nickolson Lennice Sites MD ELECTRONICALLY SIGNED 03/25/2014 17:41

## 2014-06-03 NOTE — Discharge Summary (Signed)
PATIENT NAME:  [name], [name] MR#:  [id number] DATE OF BIRTH:  05-16-1920  DATE OF ADMISSION:  03/25/2014 DATE OF DISCHARGE:  03/28/2014  DISCHARGE DIAGNOSES:  1. Status post fall with scalp laceration, status post stitches and staples.  2. Acute right posterior cerebral artery infarct, embolic, causing left  homonimous  hemianopia.  3. Chronic atrial fibrillation.  4. Chronic systolic heart failure.   DISCHARGE MEDICATIONS:  1. Align 4 mg p.o. daily. 2. Vitamin D 50,000 units once a month.  3. Zaroxolyn 2.5 mg Monday, Wednesday, Friday, prior to furosemide dose.  4. Coreg 3.125 mg p.o. b.i.d. 5. Ferrous sulfate 325 mg p.o. daily.  6. Folic acid 0.4 mg p.o. daily.  7. Lisinopril 2.5 mg p.o. daily.  8. Senna as needed for constipation.  9. MiraLax as needed for constipation.  10. Potassium chloride 20 mEq p.o. t.i.d. with meals to take only when she takes the Lasix  11. Carafate 1 gram p.o. 4 times daily.  12. Bisacodyl suppository as needed for constipation.  13. Protonix 40 mg p.o. b.i.d.  14. Reglan 5 mg p.o. b.i.d.  15. DuoNebs 1 inhaler 4 times a day as needed for wheezing.  16. Furosemide 20 mg 2 tablets once a day.  17. Zoloft 25 mg p.o. daily. 18. Symbicort 80/4.5 one puff q.12 h.  19. Zofran 4 mg q.6 h.  20. Keflex 500 mg q.8 h. for 7 days.  21. Aspirin 81 mg daily.   CONSULTATIONS: Cardiology consult, Dr. Neoma Laming.   HOSPITAL COURSE: A 81 year old female patient, lives at [facility], comes in because of elevated troponins and a fall. The patient has a history of dementia, cardiac arrhythmia, coronary artery disease status post MI and stent placement in 2014, and also a history of chronic systolic heart failure with EF of around 40%. The patient had a fall at the nursing home and had a head injury. The patient's CT head was unremarkable on admission. It showed a superficial scalp laceration, so she had stitches and also staples in the  Emergency Room, and she was admitted to observation status. The patient did have a terrible headache due to concussion, but did not have any problems the following day, but noted to have left  homonimous hemianopia  yesterday, February 23, so I did an MRA of the brain, which showed acute right PCA infarct. The patient does have chronic atrial fibrillation t, but she is not a candidate for anticoagulation because of 81 and also has falls, so she is on aspirin 81 mg daily. The patient had a slightly elevated troponin of 0.04, so because of that, cardiology was consulted and cardiology thought the elevated troponins were of unclear significance because he had no chest pain and no EKG changes. The patient's echocardiogram showed no acute wall motion abnormality or pericardial effusion. The patient saw physical therapy. They recommended a SNF and the patient's family initially wanted her to go to [facility], but finally they said that they will move her to a private room in [facility], and, right now, she is stable to go to [facility].   LABORATORY DATA: White count on admission 7, hemoglobin is 13.3, hematocrit 41.0, platelets 242,000.   Electrolytes on admission, sodium 138, potassium 3.6, chloride 100, bicarbonate 30, BUN 34, creatinine 1.2, glucose 120.   Troponins 0.04, 0.06 and 0.07.   The patient's head CT on admission showed no cervical spine fracture, scalp laceration with no skull fracture, no intracranial hemorrhage, moderate  diffuse cerebral atrophy and cerebellar atrophy. The patient has degenerative cervical disk disease with multiple subluxations.   CT of the cervical spine showed scalp lacerations, moderate diffuse cerebellar atrophy. No cervical spine fractures.   Echocardiogram showed EF of 40% to 45% with dilated right ventricle and also dilated left atrium.   The patient's potassium dropped to 3.42, which was replaced.   MRA of the brain was done  yesterday. It showed acute right PCA infarct with some petechial hemorrhages. The patient's MRA findings were discussed with the radiologist. He said these petechial hemorrhages are commonly seen with acute stroke and not worrisome for any intracranial hemorrhage.   The patient was seen today. She is alert, oriented. She does have some headache and the left lateral hemianopia, which is due to stroke. The patient is alert, oriented, and has no other focal neurological deficit.   She does have staples and sutures, so I gave her Keflex to prophylactically cover for infections.   DISCHARGE VITAL SIGNS: Today temperature is 98.3, heart rate 76, blood pressure 160/60, saturations 99%.  Physcial Exam;CVS;s1.s2 regular Lungs;Clear to Auscultation Abdomen;Soft,NT,ND ,BS present  DISPOSITION: The patient is going to [facility].  CODE STATUS: DNR.   TIME SPENT: More than 30 minutes.    ____________________________ Epifanio Lesches, MD sk:JT D: 03/28/2014 10:06:00 ET T: 03/28/2014 11:40:35 ET JOB#: [id number]  cc: Epifanio Lesches, MD, <Dictator> Epifanio Lesches MD ELECTRONICALLY SIGNED 04/09/2014 13:41

## 2014-06-03 NOTE — Discharge Summary (Signed)
PATIENT NAME:  [name], [name] MR#:  [id number] DATE OF BIRTH:  Aug 20, 1920  DATE OF ADMISSION:  03/25/2014 DATE OF DISCHARGE:  03/28/2014   ADDENDUM:   Aspirin full dose at 325 mg daily. I did discuss with Dr. Neoma Laming. We decided that she will need a full-dose aspirin because of the acute stroke and chronic atrial fibrillation.    ____________________________ Epifanio Lesches, MD sk:je D: 03/28/2014 10:12:04 ET T: 03/28/2014 10:46:21 ET JOB#: [id number]  cc: Epifanio Lesches, MD, <Dictator> Epifanio Lesches MD ELECTRONICALLY SIGNED 04/09/2014 13:22

## 2014-06-03 NOTE — Consult Note (Signed)
PATIENT NAME:  [name], GUASTELLA MR#:  [id number] DATE OF BIRTH:  April 04, 1920  DATE OF CONSULTATION:  03/25/2014  CONSULTING PHYSICIAN:  Dionisio David, MD  INDICATION FOR CONSULTATION: A syncopal episode.   HISTORY OF PRESENT ILLNESS: This is a 81 year old white female with a past medical history of myocardial infarction, status post PCI and stenting last year who presented to the Emergency Room after falling. According to the daughter, she pressed a button to help her get out of the bed, but apparently before the help got there she decided to get out of the bed in the nursing home and fell out and had laceration on the left forehead. Thus she fell and I was asked to evaluate the patient because her troponin is elevated.  The patient is unable to give much history. She is sleeping right now.   PAST MEDICAL HISTORY: History of atrial fibrillation, history of myocardial infarction, history of PCI and stenting.   SOCIAL HISTORY: Unremarkable.   FAMILY HISTORY: Positive for coronary artery disease.   PHYSICAL EXAMINATION:  GENERAL: She is alert and oriented x 0 right now sleeping. Probably had a concussion. Heart rate is 74. Monitor shows atrial fibrillation, blood pressure is 120/70, respirations 18, and afebrile.  HEENT:  Exam revealed no JVD. On the forehead there is a laceration and bleeding with the head being sutured and Band-Aid.  LUNGS: Good air entry.  HEART: Irregularly irregular. Normal S1, S2. No audible murmur.  ABDOMEN: Soft, nontender, positive bowel sounds.  EXTREMITIES: No pedal edema.  NEUROLOGIC: She is alert, oriented x 0.   DIAGNOSTIC DATA:  EKG shows atrial fibrillation 74 beats per minute, left ventricular hypertrophy, occasional PVCs, nonspecific ST-T changes. Labs show white count of 7, hemoglobin and hematocrit 13.5 and 41.3. BUN is 34, creatinine 1.3. Troponin was 0.06 and 0.04.   ASSESSMENT AND PLAN: Status post fall with laceration to the forehead with bleeding due  to dementia and not due to any other causes. There are no acute EKG changes. The patient appears to be comfortable. Mildly elevated troponin of 0.06 is insignificant. Advised sending the patient back to the nursing home but if she is admitted we will just get an echocardiogram just to see if there is normal wall motion.    ___________________________ Dionisio David, MD sak:mc D: 03/25/2014 15:07:00 ET T: 03/25/2014 15:38:27 ET JOB#: [id number]  cc: Dionisio David, MD, <Dictator> Dionisio David MD ELECTRONICALLY SIGNED 04/05/2014 12:18

## 2014-11-24 ENCOUNTER — Emergency Department: Payer: Medicare Other

## 2014-11-24 ENCOUNTER — Encounter: Payer: Self-pay | Admitting: Emergency Medicine

## 2014-11-24 ENCOUNTER — Observation Stay
Admit: 2014-11-24 | Discharge: 2014-11-24 | Disposition: A | Discharge status: Home / Self Care | Payer: Medicare Other | Attending: Internal Medicine | Admitting: Internal Medicine

## 2014-11-24 ENCOUNTER — Observation Stay: Payer: Medicare Other

## 2014-11-24 ENCOUNTER — Observation Stay
Admission: EM | Admit: 2014-11-24 | Discharge: 2014-11-25 | Disposition: A | Discharge status: Skilled Nursing Facility | Payer: Medicare Other | Attending: Internal Medicine | Admitting: Internal Medicine

## 2014-11-24 DIAGNOSIS — I071 Rheumatic tricuspid insufficiency: Secondary | ICD-10-CM | POA: Insufficient documentation

## 2014-11-24 DIAGNOSIS — Z820 Family history of epilepsy and other diseases of the nervous system: Secondary | ICD-10-CM | POA: Insufficient documentation

## 2014-11-24 DIAGNOSIS — K5900 Constipation, unspecified: Secondary | ICD-10-CM | POA: Diagnosis not present

## 2014-11-24 DIAGNOSIS — W19XXXA Unspecified fall, initial encounter: Secondary | ICD-10-CM | POA: Insufficient documentation

## 2014-11-24 DIAGNOSIS — R42 Dizziness and giddiness: Secondary | ICD-10-CM | POA: Diagnosis not present

## 2014-11-24 DIAGNOSIS — I130 Hypertensive heart and chronic kidney disease with heart failure and stage 1 through stage 4 chronic kidney disease, or unspecified chronic kidney disease: Secondary | ICD-10-CM | POA: Diagnosis not present

## 2014-11-24 DIAGNOSIS — I6523 Occlusion and stenosis of bilateral carotid arteries: Secondary | ICD-10-CM | POA: Insufficient documentation

## 2014-11-24 DIAGNOSIS — M50323 Other cervical disc degeneration at C6-C7 level: Secondary | ICD-10-CM | POA: Insufficient documentation

## 2014-11-24 DIAGNOSIS — R918 Other nonspecific abnormal finding of lung field: Secondary | ICD-10-CM | POA: Insufficient documentation

## 2014-11-24 DIAGNOSIS — R0989 Other specified symptoms and signs involving the circulatory and respiratory systems: Secondary | ICD-10-CM | POA: Insufficient documentation

## 2014-11-24 DIAGNOSIS — Z96649 Presence of unspecified artificial hip joint: Secondary | ICD-10-CM | POA: Insufficient documentation

## 2014-11-24 DIAGNOSIS — N189 Chronic kidney disease, unspecified: Secondary | ICD-10-CM | POA: Diagnosis not present

## 2014-11-24 DIAGNOSIS — Z8249 Family history of ischemic heart disease and other diseases of the circulatory system: Secondary | ICD-10-CM | POA: Diagnosis not present

## 2014-11-24 DIAGNOSIS — Z888 Allergy status to other drugs, medicaments and biological substances status: Secondary | ICD-10-CM | POA: Insufficient documentation

## 2014-11-24 DIAGNOSIS — I340 Nonrheumatic mitral (valve) insufficiency: Secondary | ICD-10-CM | POA: Diagnosis not present

## 2014-11-24 DIAGNOSIS — Z90710 Acquired absence of both cervix and uterus: Secondary | ICD-10-CM | POA: Diagnosis not present

## 2014-11-24 DIAGNOSIS — E785 Hyperlipidemia, unspecified: Secondary | ICD-10-CM | POA: Diagnosis not present

## 2014-11-24 DIAGNOSIS — Y92129 Unspecified place in nursing home as the place of occurrence of the external cause: Secondary | ICD-10-CM | POA: Diagnosis not present

## 2014-11-24 DIAGNOSIS — R296 Repeated falls: Secondary | ICD-10-CM | POA: Insufficient documentation

## 2014-11-24 DIAGNOSIS — Z886 Allergy status to analgesic agent status: Secondary | ICD-10-CM | POA: Diagnosis not present

## 2014-11-24 DIAGNOSIS — R002 Palpitations: Secondary | ICD-10-CM | POA: Insufficient documentation

## 2014-11-24 DIAGNOSIS — Z9849 Cataract extraction status, unspecified eye: Secondary | ICD-10-CM | POA: Diagnosis not present

## 2014-11-24 DIAGNOSIS — Z841 Family history of disorders of kidney and ureter: Secondary | ICD-10-CM | POA: Insufficient documentation

## 2014-11-24 DIAGNOSIS — Z9889 Other specified postprocedural states: Secondary | ICD-10-CM | POA: Insufficient documentation

## 2014-11-24 DIAGNOSIS — I482 Chronic atrial fibrillation: Secondary | ICD-10-CM | POA: Insufficient documentation

## 2014-11-24 DIAGNOSIS — S0003XA Contusion of scalp, initial encounter: Secondary | ICD-10-CM | POA: Diagnosis not present

## 2014-11-24 DIAGNOSIS — M419 Scoliosis, unspecified: Secondary | ICD-10-CM | POA: Insufficient documentation

## 2014-11-24 DIAGNOSIS — N179 Acute kidney failure, unspecified: Secondary | ICD-10-CM | POA: Diagnosis not present

## 2014-11-24 DIAGNOSIS — I951 Orthostatic hypotension: Secondary | ICD-10-CM | POA: Diagnosis not present

## 2014-11-24 DIAGNOSIS — Z800 Family history of malignant neoplasm of digestive organs: Secondary | ICD-10-CM | POA: Diagnosis not present

## 2014-11-24 DIAGNOSIS — Z7901 Long term (current) use of anticoagulants: Secondary | ICD-10-CM | POA: Diagnosis not present

## 2014-11-24 DIAGNOSIS — Z79899 Other long term (current) drug therapy: Secondary | ICD-10-CM | POA: Diagnosis not present

## 2014-11-24 DIAGNOSIS — I272 Other secondary pulmonary hypertension: Secondary | ICD-10-CM | POA: Diagnosis not present

## 2014-11-24 DIAGNOSIS — G8929 Other chronic pain: Secondary | ICD-10-CM | POA: Insufficient documentation

## 2014-11-24 DIAGNOSIS — J45909 Unspecified asthma, uncomplicated: Secondary | ICD-10-CM | POA: Insufficient documentation

## 2014-11-24 DIAGNOSIS — R55 Syncope and collapse: Secondary | ICD-10-CM | POA: Diagnosis not present

## 2014-11-24 DIAGNOSIS — F0390 Unspecified dementia without behavioral disturbance: Secondary | ICD-10-CM | POA: Diagnosis not present

## 2014-11-24 DIAGNOSIS — R531 Weakness: Secondary | ICD-10-CM | POA: Insufficient documentation

## 2014-11-24 DIAGNOSIS — Z96641 Presence of right artificial hip joint: Secondary | ICD-10-CM | POA: Diagnosis not present

## 2014-11-24 DIAGNOSIS — I517 Cardiomegaly: Secondary | ICD-10-CM | POA: Insufficient documentation

## 2014-11-24 DIAGNOSIS — I5022 Chronic systolic (congestive) heart failure: Secondary | ICD-10-CM | POA: Insufficient documentation

## 2014-11-24 DIAGNOSIS — I351 Nonrheumatic aortic (valve) insufficiency: Secondary | ICD-10-CM | POA: Insufficient documentation

## 2014-11-24 DIAGNOSIS — M19012 Primary osteoarthritis, left shoulder: Secondary | ICD-10-CM | POA: Diagnosis not present

## 2014-11-24 DIAGNOSIS — J90 Pleural effusion, not elsewhere classified: Secondary | ICD-10-CM | POA: Insufficient documentation

## 2014-11-24 DIAGNOSIS — R6884 Jaw pain: Secondary | ICD-10-CM | POA: Diagnosis not present

## 2014-11-24 DIAGNOSIS — Z9861 Coronary angioplasty status: Secondary | ICD-10-CM | POA: Insufficient documentation

## 2014-11-24 DIAGNOSIS — R011 Cardiac murmur, unspecified: Secondary | ICD-10-CM | POA: Insufficient documentation

## 2014-11-24 DIAGNOSIS — I2510 Atherosclerotic heart disease of native coronary artery without angina pectoris: Secondary | ICD-10-CM | POA: Insufficient documentation

## 2014-11-24 DIAGNOSIS — Y939 Activity, unspecified: Secondary | ICD-10-CM | POA: Diagnosis not present

## 2014-11-24 LAB — TSH: TSH: 3.616 u[IU]/mL (ref 0.350–4.500)

## 2014-11-24 LAB — CBC WITH DIFFERENTIAL/PLATELET
BASOS ABS: 0 10*3/uL (ref 0–0.1)
Basophils Relative: 1 %
EOS ABS: 0.2 10*3/uL (ref 0–0.7)
EOS PCT: 3 %
HCT: 34.4 % — ABNORMAL LOW (ref 35.0–47.0)
Hemoglobin: 11.4 g/dL — ABNORMAL LOW (ref 12.0–16.0)
LYMPHS PCT: 6 %
Lymphs Abs: 0.5 10*3/uL — ABNORMAL LOW (ref 1.0–3.6)
MCH: 31.3 pg (ref 26.0–34.0)
MCHC: 33 g/dL (ref 32.0–36.0)
MCV: 95 fL (ref 80.0–100.0)
Monocytes Absolute: 0.6 10*3/uL (ref 0.2–0.9)
Monocytes Relative: 7 %
Neutro Abs: 6.6 10*3/uL — ABNORMAL HIGH (ref 1.4–6.5)
Neutrophils Relative %: 83 %
PLATELETS: 199 10*3/uL (ref 150–440)
RBC: 3.62 MIL/uL — AB (ref 3.80–5.20)
RDW: 14.4 % (ref 11.5–14.5)
WBC: 7.9 10*3/uL (ref 3.6–11.0)

## 2014-11-24 LAB — COMPREHENSIVE METABOLIC PANEL
ALBUMIN: 3.5 g/dL (ref 3.5–5.0)
ALT: 17 U/L (ref 14–54)
ANION GAP: 5 (ref 5–15)
AST: 20 U/L (ref 15–41)
Alkaline Phosphatase: 73 U/L (ref 38–126)
BUN: 44 mg/dL — AB (ref 6–20)
CHLORIDE: 106 mmol/L (ref 101–111)
CO2: 27 mmol/L (ref 22–32)
Calcium: 9.2 mg/dL (ref 8.9–10.3)
Creatinine, Ser: 1.71 mg/dL — ABNORMAL HIGH (ref 0.44–1.00)
GFR calc Af Amer: 29 mL/min — ABNORMAL LOW (ref >60)
GFR calc non Af Amer: 25 mL/min — ABNORMAL LOW (ref >60)
Glucose, Bld: 128 mg/dL — ABNORMAL HIGH (ref 65–99)
POTASSIUM: 4.3 mmol/L (ref 3.5–5.1)
SODIUM: 138 mmol/L (ref 135–145)
TOTAL PROTEIN: 6.2 g/dL — AB (ref 6.5–8.1)
Total Bilirubin: 0.5 mg/dL (ref 0.3–1.2)

## 2014-11-24 LAB — URINALYSIS COMPLETE WITH MICROSCOPIC (ARMC ONLY)
Bilirubin Urine: NEGATIVE
Glucose, UA: NEGATIVE mg/dL
Ketones, ur: NEGATIVE mg/dL
Leukocytes, UA: NEGATIVE
Nitrite: NEGATIVE
Protein, ur: NEGATIVE mg/dL
Specific Gravity, Urine: 1.006 (ref 1.005–1.030)
pH: 5 (ref 5.0–8.0)

## 2014-11-24 LAB — PROTIME-INR
INR: 1.12
Prothrombin Time: 14.6 seconds (ref 11.4–15.0)

## 2014-11-24 LAB — MRSA PCR SCREENING: MRSA BY PCR: POSITIVE — AB

## 2014-11-24 LAB — HEMOGLOBIN A1C: Hgb A1c MFr Bld: 5.5 % (ref 4.0–6.0)

## 2014-11-24 MED ORDER — ALPRAZOLAM 0.25 MG PO TABS
0.2500 mg | ORAL_TABLET | Freq: Three times a day (TID) | ORAL | Status: DC | PRN
Start: 2014-11-24 — End: 2014-11-25

## 2014-11-24 MED ORDER — ONDANSETRON HCL 4 MG PO TABS: 4.0000 mg | ORAL_TABLET | Freq: Four times a day (QID) | ORAL | Status: DC | PRN

## 2014-11-24 MED ORDER — APIXABAN 5 MG PO TABS
2.5000 mg | ORAL_TABLET | Freq: Two times a day (BID) | ORAL | Status: DC
  Administered 2014-11-24: 2.5 mg via ORAL
  Administered 2014-11-24: 5 mg via ORAL
  Administered 2014-11-25: 2.5 mg via ORAL
  Filled 2014-11-24 (×3): qty 1

## 2014-11-24 MED ORDER — MENTHOL 3 MG MT LOZG
1.0000 | LOZENGE | OROMUCOSAL | Status: DC | PRN
  Filled 2014-11-24: qty 9

## 2014-11-24 MED ORDER — POTASSIUM CHLORIDE CRYS ER 20 MEQ PO TBCR
20.0000 meq | EXTENDED_RELEASE_TABLET | Freq: Three times a day (TID) | ORAL | Status: DC
  Administered 2014-11-24 – 2014-11-25 (×5): 20 meq via ORAL
  Filled 2014-11-24 (×5): qty 1

## 2014-11-24 MED ORDER — FOLIC ACID 400 MCG PO TABS
400.0000 ug | ORAL_TABLET | Freq: Every day | ORAL | Status: DC
  Filled 2014-11-24: qty 1

## 2014-11-24 MED ORDER — PRAMOX-PE-GLYCERIN-PETROLATUM 1-0.25-14.4-15 % RE CREA
1.0000 "application " | TOPICAL_CREAM | Freq: Three times a day (TID) | RECTAL | Status: DC | PRN
  Filled 2014-11-24: qty 1

## 2014-11-24 MED ORDER — ACETAMINOPHEN 325 MG PO TABS: 650.0000 mg | ORAL_TABLET | Freq: Four times a day (QID) | ORAL | Status: DC | PRN

## 2014-11-24 MED ORDER — POLYETHYLENE GLYCOL 3350 17 G PO PACK
17.0000 g | PACK | Freq: Every day | ORAL | Status: DC
  Administered 2014-11-24 – 2014-11-25 (×2): 17 g via ORAL
  Filled 2014-11-24 (×2): qty 1

## 2014-11-24 MED ORDER — DISPOSABLE ENEMA 19-7 GM/118ML RE ENEM: 1.0000 | ENEMA | Freq: Every day | RECTAL | Status: DC | PRN

## 2014-11-24 MED ORDER — BUDESONIDE-FORMOTEROL FUMARATE 80-4.5 MCG/ACT IN AERO
2.0000 | INHALATION_SPRAY | Freq: Two times a day (BID) | RESPIRATORY_TRACT | Status: DC
  Administered 2014-11-24 – 2014-11-25 (×3): 2 via RESPIRATORY_TRACT
  Filled 2014-11-24: qty 6.9

## 2014-11-24 MED ORDER — SODIUM CHLORIDE 0.9 % IV SOLN
INTRAVENOUS | Status: DC
  Administered 2014-11-24 – 2014-11-25 (×2): via INTRAVENOUS

## 2014-11-24 MED ORDER — ISOSORBIDE MONONITRATE ER 30 MG PO TB24
30.0000 mg | ORAL_TABLET | Freq: Every day | ORAL | Status: DC
  Administered 2014-11-24 – 2014-11-25 (×2): 30 mg via ORAL
  Filled 2014-11-24 (×2): qty 1

## 2014-11-24 MED ORDER — METOCLOPRAMIDE HCL 10 MG PO TABS
5.0000 mg | ORAL_TABLET | Freq: Two times a day (BID) | ORAL | Status: DC
  Administered 2014-11-24: 5 mg via ORAL
  Administered 2014-11-24: 10 mg via ORAL
  Administered 2014-11-25: 5 mg via ORAL
  Filled 2014-11-24 (×3): qty 1

## 2014-11-24 MED ORDER — FOLIC ACID 1 MG PO TABS
1.0000 mg | ORAL_TABLET | Freq: Every day | ORAL | Status: DC
  Administered 2014-11-24 – 2014-11-25 (×2): 1 mg via ORAL
  Filled 2014-11-24 (×2): qty 1

## 2014-11-24 MED ORDER — ACETAMINOPHEN 650 MG RE SUPP: 650.0000 mg | Freq: Four times a day (QID) | RECTAL | Status: DC | PRN

## 2014-11-24 MED ORDER — ALUM & MAG HYDROXIDE-SIMETH 200-200-20 MG/5ML PO SUSP: 30.0000 mL | ORAL | Status: DC | PRN

## 2014-11-24 MED ORDER — CALCIUM CARBONATE ANTACID 500 MG PO CHEW: 2.0000 | CHEWABLE_TABLET | Freq: Four times a day (QID) | ORAL | Status: DC | PRN

## 2014-11-24 MED ORDER — SUCRALFATE 1 G PO TABS
1.0000 g | ORAL_TABLET | Freq: Two times a day (BID) | ORAL | Status: DC
  Administered 2014-11-24 – 2014-11-25 (×3): 1 g via ORAL
  Filled 2014-11-24 (×3): qty 1

## 2014-11-24 MED ORDER — IPRATROPIUM-ALBUTEROL 0.5-2.5 (3) MG/3ML IN SOLN: 3.0000 mL | Freq: Four times a day (QID) | RESPIRATORY_TRACT | Status: DC | PRN

## 2014-11-24 MED ORDER — NITROGLYCERIN 0.4 MG SL SUBL: 0.4000 mg | SUBLINGUAL_TABLET | SUBLINGUAL | Status: DC | PRN

## 2014-11-24 MED ORDER — SODIUM CHLORIDE 0.9 % IJ SOLN
3.0000 mL | Freq: Two times a day (BID) | INTRAMUSCULAR | Status: DC
  Administered 2014-11-24 (×2): 3 mL via INTRAVENOUS

## 2014-11-24 MED ORDER — PHENYLEPH-SHARK LIV OIL-MO-PET 0.25-3-14-71.9 % RE OINT: TOPICAL_OINTMENT | Freq: Three times a day (TID) | RECTAL | Status: DC | PRN

## 2014-11-24 MED ORDER — BISACODYL 10 MG RE SUPP: 10.0000 mg | RECTAL | Status: DC | PRN

## 2014-11-24 MED ORDER — RISAQUAD PO CAPS
1.0000 | ORAL_CAPSULE | Freq: Every day | ORAL | Status: DC
  Administered 2014-11-24 – 2014-11-25 (×2): 1 via ORAL
  Filled 2014-11-24 (×3): qty 1

## 2014-11-24 MED ORDER — FERROUS SULFATE 325 (65 FE) MG PO TABS
325.0000 mg | ORAL_TABLET | Freq: Every day | ORAL | Status: DC
  Administered 2014-11-24 – 2014-11-25 (×2): 325 mg via ORAL
  Filled 2014-11-24 (×2): qty 1

## 2014-11-24 MED ORDER — LISINOPRIL 5 MG PO TABS
2.5000 mg | ORAL_TABLET | Freq: Every day | ORAL | Status: DC
  Administered 2014-11-24 – 2014-11-25 (×2): 2.5 mg via ORAL
  Filled 2014-11-24 (×2): qty 1

## 2014-11-24 MED ORDER — SERTRALINE HCL 50 MG PO TABS
100.0000 mg | ORAL_TABLET | Freq: Every day | ORAL | Status: DC
  Administered 2014-11-24 – 2014-11-25 (×2): 100 mg via ORAL
  Filled 2014-11-24 (×2): qty 2

## 2014-11-24 MED ORDER — METOLAZONE 2.5 MG PO TABS: 2.5000 mg | ORAL_TABLET | ORAL | Status: DC

## 2014-11-24 MED ORDER — CARVEDILOL 6.25 MG PO TABS
6.2500 mg | ORAL_TABLET | Freq: Two times a day (BID) | ORAL | Status: DC
  Administered 2014-11-24 – 2014-11-25 (×4): 6.25 mg via ORAL
  Filled 2014-11-24 (×4): qty 1

## 2014-11-24 MED ORDER — VITAMIN D3 1.25 MG (50000 UT) PO CAPS: 1.0000 | ORAL_CAPSULE | ORAL | Status: DC

## 2014-11-24 MED ORDER — VITAMIN D (ERGOCALCIFEROL) 1.25 MG (50000 UNIT) PO CAPS
50000.0000 [IU] | ORAL_CAPSULE | ORAL | Status: AC
  Administered 2014-11-24: 50000 [IU] via ORAL
  Filled 2014-11-24: qty 1

## 2014-11-24 MED ORDER — PANTOPRAZOLE SODIUM 40 MG PO TBEC
40.0000 mg | DELAYED_RELEASE_TABLET | Freq: Two times a day (BID) | ORAL | Status: DC
  Administered 2014-11-24 – 2014-11-25 (×3): 40 mg via ORAL
  Filled 2014-11-24 (×4): qty 1

## 2014-11-24 MED ORDER — GUAIFENESIN-DM 100-10 MG/5ML PO SYRP: 10.0000 mL | ORAL_SOLUTION | Freq: Four times a day (QID) | ORAL | Status: DC | PRN

## 2014-11-24 MED ORDER — SENNOSIDES-DOCUSATE SODIUM 8.6-50 MG PO TABS
1.0000 | ORAL_TABLET | Freq: Every day | ORAL | Status: DC
  Administered 2014-11-24: 1 via ORAL
  Filled 2014-11-24: qty 1

## 2014-11-24 MED ORDER — TORSEMIDE 20 MG PO TABS
40.0000 mg | ORAL_TABLET | Freq: Every day | ORAL | Status: DC
  Administered 2014-11-24 – 2014-11-25 (×2): 40 mg via ORAL
  Filled 2014-11-24 (×2): qty 2

## 2014-11-24 NOTE — ED Notes (Signed)
Lavender tube redrawn and resent to lab, they said the initial one had coagulated

## 2014-11-24 NOTE — Consult Note (Signed)
Primary Cardiologist: Dr. Neoma Laming    Reason for Consultation : Syncope/CHF   HPI : This is a 81 yo pleasant female, well known to our practice, last seen Thursday 11/22/14, with PMHx CAD s/p PCI, systolic CHF, chronic a-fib, who presented to ER this morning s/p fall at nursing home. Pt does not remember falling and suffered a contusion to R side of face. She currently denies any CP, SOB, or dizziness. Recall, pt had similar episode this past February.         Review of Systems: General: negative for chills, fever, night sweats or weight changes.  Cardiovascular: negative for chest pain, edema, orthopnea, palpitations, paroxysmal nocturnal dyspnea, shortness of breath or dyspnea on exertion Dermatological: negative for rash Respiratory: negative for cough or wheezing Urologic: negative for hematuria Abdominal: negative for nausea, vomiting, diarrhea, bright red blood per rectum, melena, or hematemesis Neurologic: negative for visual changes, syncope, or dizziness All other systems reviewed and are otherwise negative except as noted above.    Past Medical History  Diagnosis Date  . Constipation     chronic  . Asthma   . Heart murmur   . Scoliosis     severe, affects her digestion, GI function- chronic pain  . AR (allergic rhinitis)   . Palpitations   . Hyperlipidemia   . Hypertension   . Pneumonia     history of  . DDD (degenerative disc disease)     with injections  . Osteoarthritis     s/p right hip replacement  . Renal insufficiency   . CAD (coronary artery disease)     non obstructive. Cath 2008 with medical treatment    Medications Prior to Admission  Medication Sig Dispense Refill  . acetaminophen (TYLENOL) 325 MG tablet Take 650 mg by mouth every 4 (four) hours as needed for moderate pain.     Marland Kitchen ALPRAZolam (XANAX) 0.25 MG tablet Take 0.25 mg by mouth 3 (three) times daily as needed for anxiety.    Marland Kitchen aluminum-magnesium hydroxide-simethicone (MAALOX)  403-474-25 MG/5ML SUSP Take 30 mLs by mouth every 4 (four) hours as needed (dyspepsia).    Marland Kitchen apixaban (ELIQUIS) 2.5 MG TABS tablet Take 2.5 mg by mouth 2 (two) times daily.    . bisacodyl (DULCOLAX) 10 MG suppository Place 10 mg rectally as needed for moderate constipation.    . budesonide-formoterol (SYMBICORT) 80-4.5 MCG/ACT inhaler Inhale 2 puffs into the lungs 2 (two) times daily.    . calcium carbonate (TUMS - DOSED IN MG ELEMENTAL CALCIUM) 500 MG chewable tablet Chew 2 tablets by mouth every 6 (six) hours as needed for indigestion or heartburn.    . carvedilol (COREG) 6.25 MG tablet Take 6.25 mg by mouth 2 (two) times daily with a meal.    . Cholecalciferol (VITAMIN D3) 50000 UNITS CAPS Take 1 capsule by mouth every 28 (twenty-eight) days.    . ferrous sulfate 325 (65 FE) MG tablet Take 325 mg by mouth daily with breakfast.    . folic acid (FOLVITE) 956 MCG tablet Take 400 mcg by mouth daily.    Marland Kitchen guaiFENesin-dextromethorphan (ROBITUSSIN DM) 100-10 MG/5ML syrup Take 10 mLs by mouth every 6 (six) hours as needed for cough (congestion).    Marland Kitchen ipratropium-albuterol (DUONEB) 0.5-2.5 (3) MG/3ML SOLN Take 3 mLs by nebulization every 6 (six) hours as needed (wheezing).    . isosorbide mononitrate (IMDUR) 30 MG 24 hr tablet Take 30 mg by mouth daily.    Marland Kitchen lisinopril (PRINIVIL,ZESTRIL) 2.5 MG  tablet Take 2.5 mg by mouth daily.    Marland Kitchen menthol-cetylpyridinium (CEPACOL) 3 MG lozenge Take 1 lozenge by mouth every 4 (four) hours as needed for sore throat.    . metoCLOPramide (REGLAN) 5 MG tablet Take 5 mg by mouth 2 (two) times daily.    . metolazone (ZAROXOLYN) 2.5 MG tablet Take 2.5 mg by mouth 3 (three) times a week. Monday, Wednesday, Friday    . nitroGLYCERIN (NITROSTAT) 0.4 MG SL tablet Place 0.4 mg under the tongue every 5 (five) minutes as needed for chest pain.    Marland Kitchen ondansetron (ZOFRAN) 4 MG tablet Take 4 mg by mouth every 6 (six) hours as needed for nausea or vomiting.    . pantoprazole (PROTONIX)  40 MG tablet Take 40 mg by mouth 2 (two) times daily.    . polyethylene glycol (MIRALAX) packet Take 17 g by mouth daily.     . potassium chloride (K-DUR,KLOR-CON) 10 MEQ tablet Take 20 mEq by mouth 3 (three) times daily.    . Pramox-PE-Glycerin-Petrolatum (PREPARATION H) 1-0.25-14.4-15 % CREA Place 1 application rectally every 8 (eight) hours as needed (hemorrhoids).    . Probiotic Product (ALIGN) 4 MG CAPS Take 1 capsule by mouth daily.    Marland Kitchen senna-docusate (SENOKOT-S) 8.6-50 MG tablet Take 1 tablet by mouth at bedtime.    . sertraline (ZOLOFT) 100 MG tablet Take 100 mg by mouth daily.    . sodium phosphate (FLEET) enema Place 1 enema rectally daily as needed (constipation). follow package directions    . sucralfate (CARAFATE) 1 G tablet Take 1 g by mouth 2 (two) times daily.    Marland Kitchen torsemide (DEMADEX) 20 MG tablet Take 40 mg by mouth daily.    Marland Kitchen amLODipine-benazepril (LOTREL) 10-40 MG per capsule Take 1 capsule by mouth daily. (Patient not taking: Reported on 11/24/2014) 90 capsule 3  . furosemide (LASIX) 20 MG tablet Take one pill by mouth once weekly to help with leg swelling (Patient not taking: Reported on 11/24/2014) 30 tablet 0  . potassium chloride SA (K-DUR,KLOR-CON) 20 MEQ tablet 1/2 pill by mouth once daily 45 tablet 3     . acidophilus  1 capsule Oral Daily  . apixaban  2.5 mg Oral BID  . budesonide-formoterol  2 puff Inhalation BID  . carvedilol  6.25 mg Oral BID WC  . ferrous sulfate  325 mg Oral Q breakfast  . folic acid  1 mg Oral Daily  . isosorbide mononitrate  30 mg Oral Daily  . lisinopril  2.5 mg Oral Daily  . metoCLOPramide  5 mg Oral BID  . [START ON 11/26/2014] metolazone  2.5 mg Oral Once per day on Mon Wed Fri  . pantoprazole  40 mg Oral BID  . polyethylene glycol  17 g Oral Daily  . potassium chloride  20 mEq Oral TID  . senna-docusate  1 tablet Oral QHS  . sertraline  100 mg Oral Daily  . sodium chloride  3 mL Intravenous Q12H  . sucralfate  1 g Oral BID  .  torsemide  40 mg Oral Daily    Infusions: . sodium chloride 75 mL/hr at 11/24/14 0955    Allergies  Allergen Reactions  . Aspirin   . Celebrex [Celecoxib] Nausea Only  . Detrol [Tolterodine Tartrate]     REACTION: Not effective  . Rosuvastatin     REACTION: Severe leg edema  . Simvastatin     REACTION: Feet burn    Social History   Social History  .  Marital Status: Widowed    Spouse Name: N/A  . Number of Children: 5  . Years of Education: N/A   Occupational History  . RETIRED    Social History Main Topics  . Smoking status: Never Smoker   . Smokeless tobacco: Never Used  . Alcohol Use: Yes     Comment: Very rare  . Drug Use: No  . Sexual Activity: Not on file   Other Topics Concern  . Not on file   Social History Narrative    Family History  Problem Relation Age of Onset  . Hypertension Mother   . Kidney disease Mother   . Heart disease Father     MI  . Alzheimer's disease Brother   . Cancer Paternal Uncle     esophageal    PHYSICAL EXAM: Filed Vitals:   11/24/14 1358  BP: 112/68  Pulse: 82  Temp:   Resp:      Intake/Output Summary (Last 24 hours) at 11/24/14 1406 Last data filed at 11/24/14 1232  Gross per 24 hour  Intake    360 ml  Output    100 ml  Net    260 ml    General:  Well appearing. No respiratory difficulty HEENT: normal Neck: supple. no JVD. Carotids 2+ bilat; no bruits. No lymphadenopathy or thryomegaly appreciated. Cor: PMI nondisplaced. Regular rate & rhythm. No rubs, gallops or murmurs. Lungs: clear Abdomen: soft, nontender, nondistended. No hepatosplenomegaly. No bruits or masses. Good bowel sounds. Extremities: no cyanosis, clubbing, rash, edema Neuro: alert & oriented x 3, cranial nerves grossly intact. moves all 4 extremities w/o difficulty. Affect pleasant.  Tele: a-fib VR in 60s  Results for orders placed or performed during the hospital encounter of 11/24/14 (from the past 24 hour(s))  CBC with Differential      Status: Abnormal   Collection Time: 11/24/14  3:03 AM  Result Value Ref Range   WBC 7.9 3.6 - 11.0 K/uL   RBC 3.62 (L) 3.80 - 5.20 MIL/uL   Hemoglobin 11.4 (L) 12.0 - 16.0 g/dL   HCT 34.4 (L) 35.0 - 47.0 %   MCV 95.0 80.0 - 100.0 fL   MCH 31.3 26.0 - 34.0 pg   MCHC 33.0 32.0 - 36.0 g/dL   RDW 14.4 11.5 - 14.5 %   Platelets 199 150 - 440 K/uL   Neutrophils Relative % 83 %   Neutro Abs 6.6 (H) 1.4 - 6.5 K/uL   Lymphocytes Relative 6 %   Lymphs Abs 0.5 (L) 1.0 - 3.6 K/uL   Monocytes Relative 7 %   Monocytes Absolute 0.6 0.2 - 0.9 K/uL   Eosinophils Relative 3 %   Eosinophils Absolute 0.2 0 - 0.7 K/uL   Basophils Relative 1 %   Basophils Absolute 0.0 0 - 0.1 K/uL  Comprehensive metabolic panel     Status: Abnormal   Collection Time: 11/24/14  3:03 AM  Result Value Ref Range   Sodium 138 135 - 145 mmol/L   Potassium 4.3 3.5 - 5.1 mmol/L   Chloride 106 101 - 111 mmol/L   CO2 27 22 - 32 mmol/L   Glucose, Bld 128 (H) 65 - 99 mg/dL   BUN 44 (H) 6 - 20 mg/dL   Creatinine, Ser 1.71 (H) 0.44 - 1.00 mg/dL   Calcium 9.2 8.9 - 10.3 mg/dL   Total Protein 6.2 (L) 6.5 - 8.1 g/dL   Albumin 3.5 3.5 - 5.0 g/dL   AST 20 15 - 41 U/L  ALT 17 14 - 54 U/L   Alkaline Phosphatase 73 38 - 126 U/L   Total Bilirubin 0.5 0.3 - 1.2 mg/dL   GFR calc non Af Amer 25 (L) >60 mL/min   GFR calc Af Amer 29 (L) >60 mL/min   Anion gap 5 5 - 15  Protime-INR     Status: None   Collection Time: 11/24/14  3:03 AM  Result Value Ref Range   Prothrombin Time 14.6 11.4 - 15.0 seconds   INR 1.12   TSH     Status: None   Collection Time: 11/24/14  3:03 AM  Result Value Ref Range   TSH 3.616 0.350 - 4.500 uIU/mL  MRSA PCR Screening     Status: Abnormal   Collection Time: 11/24/14  9:46 AM  Result Value Ref Range   MRSA by PCR POSITIVE (A) NEGATIVE   Ct Head Wo Contrast  11/24/2014  CLINICAL DATA:  Initial evaluation for acute trauma, fall. EXAM: CT HEAD WITHOUT CONTRAST CT CERVICAL SPINE WITHOUT CONTRAST  TECHNIQUE: Multidetector CT imaging of the head and cervical spine was performed following the standard protocol without intravenous contrast. Multiplanar CT image reconstructions of the cervical spine were also generated. COMPARISON:  None. FINDINGS: CT HEAD FINDINGS Soft tissue contusion/laceration present at the left frontotemporal scalp and periorbital region. No radiopaque foreign body. Scalp soft tissues otherwise unremarkable. No acute abnormality about the orbits. Paranasal sinuses are clear. Mastoid air cells are clear. Middle ear cavities are clear. No calvarial fracture. Diffuse prominence of the CSF containing spaces is compatible with age-related cerebral atrophy. Patchy hypodensity within the periventricular and deep white matter most consistent with chronic small vessel ischemic disease. Vascular calcifications within the carotid siphons. No acute large vessel territory infarct. No intracranial hemorrhage. No mass lesion, midline shift or mass effect. Ventricular prominence related to global parenchymal volume loss present without hydrocephalus. No extra-axial fluid collection. CT CERVICAL SPINE FINDINGS Straightening of the normal cervical lordosis. 3 mm anterolisthesis of C2 on C3 is stable. Additionally, 3 mm of anterolisthesis of C4 on C5 is also unchanged. Trace anterolisthesis of C7 on T1, unchanged. Normal C1-2 articulations are preserved. Dens is intact. No acute fracture or traumatic malalignment. Prevertebral soft tissues within normal limits. Advanced degenerative changes about the dens. Advanced degenerative spondylolysis at C3-4 through C6-7. No acute soft tissue abnormality within the visualized neck. Prominent vascular calcifications about the carotid bifurcations. Bilateral pleural effusions are partially visualized, right greater than left. Diffuse interlobular septal thickening suggestive of mild diffuse pulmonary interstitial edema. No apical pneumothorax. IMPRESSION: CT BRAIN: 1.  No acute intracranial process. 2. Left frontotemporal and periorbital scalp contusion with laceration. 3. Advanced cerebral atrophy with Mild chronic small vessel ischemic disease. CT CERVICAL SPINE: 1. No acute traumatic injury within the cervical spine. 2. Multilevel degenerative spondylolysis with associated subluxations, stable as compared to previous. 3. Bilateral pleural effusions, right greater than left. Diffuse interlobular septal thickening within the visualized lung apices suggestive of interstitial pulmonary edema. Correlation with plain film radiography recommended. Electronically Signed   By: Jeannine Boga M.D.   On: 11/24/2014 04:08   Ct Cervical Spine Wo Contrast  11/24/2014  CLINICAL DATA:  Initial evaluation for acute trauma, fall. EXAM: CT HEAD WITHOUT CONTRAST CT CERVICAL SPINE WITHOUT CONTRAST TECHNIQUE: Multidetector CT imaging of the head and cervical spine was performed following the standard protocol without intravenous contrast. Multiplanar CT image reconstructions of the cervical spine were also generated. COMPARISON:  None. FINDINGS: CT HEAD FINDINGS Soft tissue  contusion/laceration present at the left frontotemporal scalp and periorbital region. No radiopaque foreign body. Scalp soft tissues otherwise unremarkable. No acute abnormality about the orbits. Paranasal sinuses are clear. Mastoid air cells are clear. Middle ear cavities are clear. No calvarial fracture. Diffuse prominence of the CSF containing spaces is compatible with age-related cerebral atrophy. Patchy hypodensity within the periventricular and deep white matter most consistent with chronic small vessel ischemic disease. Vascular calcifications within the carotid siphons. No acute large vessel territory infarct. No intracranial hemorrhage. No mass lesion, midline shift or mass effect. Ventricular prominence related to global parenchymal volume loss present without hydrocephalus. No extra-axial fluid collection. CT  CERVICAL SPINE FINDINGS Straightening of the normal cervical lordosis. 3 mm anterolisthesis of C2 on C3 is stable. Additionally, 3 mm of anterolisthesis of C4 on C5 is also unchanged. Trace anterolisthesis of C7 on T1, unchanged. Normal C1-2 articulations are preserved. Dens is intact. No acute fracture or traumatic malalignment. Prevertebral soft tissues within normal limits. Advanced degenerative changes about the dens. Advanced degenerative spondylolysis at C3-4 through C6-7. No acute soft tissue abnormality within the visualized neck. Prominent vascular calcifications about the carotid bifurcations. Bilateral pleural effusions are partially visualized, right greater than left. Diffuse interlobular septal thickening suggestive of mild diffuse pulmonary interstitial edema. No apical pneumothorax. IMPRESSION: CT BRAIN: 1. No acute intracranial process. 2. Left frontotemporal and periorbital scalp contusion with laceration. 3. Advanced cerebral atrophy with Mild chronic small vessel ischemic disease. CT CERVICAL SPINE: 1. No acute traumatic injury within the cervical spine. 2. Multilevel degenerative spondylolysis with associated subluxations, stable as compared to previous. 3. Bilateral pleural effusions, right greater than left. Diffuse interlobular septal thickening within the visualized lung apices suggestive of interstitial pulmonary edema. Correlation with plain film radiography recommended. Electronically Signed   By: Jeannine Boga M.D.   On: 11/24/2014 04:08   Ct Shoulder Left Wo Contrast  11/24/2014  CLINICAL DATA:  Left shoulder pain after unwitnessed fall. EXAM: CT OF THE LEFT SHOULDER WITHOUT CONTRAST TECHNIQUE: Multidetector CT imaging was performed according to the standard protocol. Multiplanar CT image reconstructions were also generated. COMPARISON:  None. FINDINGS: No acute fracture or dislocation. Severe glenohumeral osteoarthritis with near complete joint space loss, subchondral cystic  change, osteophytes and osseous remottling. Chronic rotator cuff tear with superior subluxation of humeral head abutting the undersurface of the acromion and fatty infiltration and atrophy of supraspinatus muscle. Periarticular bursal fluid collection and small joint effusion. Left ribs are intact. There is a small left pleural effusion. Atherosclerosis noted of the thoracic aorta which tortuous. Mild smooth septal thickening and peribronchial thickening noted. IMPRESSION: 1. No acute fracture dislocation. 2. Advanced chronic degenerative change of the left shoulder. 3. Incidental note of left pleural effusion in the included chest. Electronically Signed   By: Jeb Levering M.D.   On: 11/24/2014 03:46   Dg Chest Portable 1 View  11/24/2014  CLINICAL DATA:  Initial valuation for bilateral pleural effusions. EXAM: PORTABLE CHEST 1 VIEW COMPARISON:  None. FINDINGS: Moderate cardiomegaly present. Atheromatous plaque present within the aortic arch. Mediastinal silhouette within normal limits. Lungs are normally inflated. Small layering bilateral pleural effusions are present. There is mild diffuse pulmonary vascular congestion with interstitial prominence, suggesting mild diffuse pulmonary interstitial edema. No pneumothorax. No acute osseus abnormality. Severe degenerative changes about the shoulders bilaterally. IMPRESSION: 1. Cardiomegaly with diffuse pulmonary vascular congestion with indistinctness of the interstitial markings, suggesting mild diffuse pulmonary interstitial edema. 2. Small bilateral pleural effusions. Electronically Signed   By: Marland Kitchen  Jeannine Boga M.D.   On: 11/24/2014 05:00     ASSESSMENT: Syncope of unknown etiology   PLAN/DISCUSSION:  CAD s/p PCI to LAD: Pt last seen in our office 11/22/14, c/o chest tightness occasionally. Pt and pts daughter given option to complete CCTA to evaluate CAD, but refused at that time. Currently CP free, no concerning EKG changes  Systolic CHF:Pt  had echo in June of this year with EF of 39%. Advise continuation of carvedilol and lisinpril. Consider decreasing diuretics at d/c as fall is likely 2/2 orthostatic hypotension. Await echo results.   Chronic a-fib: Pt is CHADS2 score of 3 (CHF, HTN, and age) but has hx recurrent falls. Had lengthy discussion with pt and pts daughter regarding decreasing AC to ASA 325mg  daily. Daughter will discuss with other siblings and report decision tomorrow.   Syncope: CT head with no remarkable findings, check CD today. Likely 2/2 orthostatic hypotension.   Advise conservative treatment as pt is DNR and does not want any invasive testing.    Patient and plan discussed with supervising provider, Dr. Neoma Laming, who agrees with above findings.   Kelby Fam [address], [place] 11/24/2014 2:06 PM

## 2014-11-24 NOTE — Progress Notes (Signed)
*  PRELIMINARY RESULTS* Echocardiogram 2D Echocardiogram has been performed.  [name] 11/24/2014, 12:18 PM

## 2014-11-24 NOTE — ED Provider Notes (Signed)
[facility] Emergency Department Provider Note  ____________________________________________  Time seen: 3:15 AM  I have reviewed the triage vital signs and the nursing notes.  History Limited secondary to dementia HISTORY  Chief Complaint Fall     HPI [name] is a 81 y.o. female presents with history of unwitnessed fall tonight from [facility]. Per patient's daughters at bedside and they state that they were informed that she attempted to go to the bathroom without assistance which resulted in her fall. Patient has a history of dementia     Past Medical History  Diagnosis Date  . Constipation     chronic  . Asthma   . Heart murmur   . Scoliosis     severe, affects her digestion, GI function- chronic pain  . AR (allergic rhinitis)   . Palpitations   . Hyperlipidemia   . Hypertension   . Pneumonia     history of  . DDD (degenerative disc disease)     with injections  . Osteoarthritis     s/p right hip replacement  . Renal insufficiency   . CAD (coronary artery disease)     non obstructive. Cath 2008 with medical treatment    Patient Active Problem List   Diagnosis Date Noted  . Varicose veins of legs 10/16/2011  . Rash and nonspecific skin eruption 10/16/2011  . Dizziness 07/12/2011  . Right groin pain 02/16/2011  . Urinary frequency 11/15/2010  . URINARY HESITANCY 03/07/2010  . SEROMA COMPLICATING A PROCEDURE NEC 03/07/2010  . COLONIC POLYPS, HX OF 12/06/2009  . SHOULDER PAIN, RIGHT 10/15/2009  . HYPERGLYCEMIA 10/15/2009  . CORONARY ARTERY DISEASE 05/14/2009  . CONSTIPATION 05/14/2009  . VENOUS INSUFFICIENCY 05/01/2009  . VITAMIN D DEFICIENCY 12/31/2008  . RENAL INSUFFICIENCY 12/31/2008  . ORTHOSTATIC DIZZINESS 10/01/2008  . SYNCOPE 09/21/2008  . EDEMA 06/14/2007  . BAKER'S CYST, RIGHT KNEE 10/20/2006  . SYMPTOM, HEADACHE 08/09/2006  . FATIGUE 06/29/2006  . PALPITATIONS 06/29/2006  . DYSPNEA/SHORTNESS OF  BREATH 06/29/2006  . HYPERLIPIDEMIA 06/21/2006  . HYPERTENSION 06/21/2006  . ALLERGIC RHINITIS 06/21/2006  . FIBROCYSTIC BREAST DISEASE 06/21/2006  . Phillips DISEASE 06/21/2006  . INCONTINENCE, URGE 06/21/2006  . PNEUMONIA, HX OF 06/21/2006  . BREAST MASS, HX OF 06/21/2006    Past Surgical History  Procedure Laterality Date  . Abdominal hysterectomy    . Foot surgery    . Carpal tunnel release      left  . Back surgery  1999    herniated disk  . Colon surgery    . Hernia repair  2012    left inquinal, complicated by large seroma  . Tonsillectomy    . Appendectomy    . Hemorrhoid surgery    . Coronary angioplasty  2008    LAD 40%LCX okRCA 30% EF 60%  . Bladder tack    . Total hip arthroplasty  2009  . Cataract extraction  2010  . Abdominal surgery      benign fatty tumors, also took bilateral ovaries and gallbladder    Current Outpatient Rx  Name  Route  Sig  Dispense  Refill  . acetaminophen (TYLENOL) 325 MG tablet   Oral   Take 650 mg by mouth every 4 (four) hours as needed for moderate pain.          Marland Kitchen ALPRAZolam (XANAX) 0.25 MG tablet   Oral   Take 0.25 mg by mouth 3 (three) times daily as needed for anxiety.         Marland Kitchen  aluminum-magnesium hydroxide-simethicone (MAALOX) 161-096-04 MG/5ML SUSP   Oral   Take 30 mLs by mouth every 4 (four) hours as needed (dyspepsia).         Marland Kitchen apixaban (ELIQUIS) 2.5 MG TABS tablet   Oral   Take 2.5 mg by mouth 2 (two) times daily.         . bisacodyl (DULCOLAX) 10 MG suppository   Rectal   Place 10 mg rectally as needed for moderate constipation.         . budesonide-formoterol (SYMBICORT) 80-4.5 MCG/ACT inhaler   Inhalation   Inhale 2 puffs into the lungs 2 (two) times daily.         . calcium carbonate (TUMS - DOSED IN MG ELEMENTAL CALCIUM) 500 MG chewable tablet   Oral   Chew 2 tablets by mouth every 6 (six) hours as needed for indigestion or heartburn.         . carvedilol (COREG) 6.25 MG  tablet   Oral   Take 6.25 mg by mouth 2 (two) times daily with a meal.         . Cholecalciferol (VITAMIN D3) 50000 UNITS CAPS   Oral   Take 1 capsule by mouth every 28 (twenty-eight) days.         . ferrous sulfate 325 (65 FE) MG tablet   Oral   Take 325 mg by mouth daily with breakfast.         . folic acid (FOLVITE) 540 MCG tablet   Oral   Take 400 mcg by mouth daily.         Marland Kitchen guaiFENesin-dextromethorphan (ROBITUSSIN DM) 100-10 MG/5ML syrup   Oral   Take 10 mLs by mouth every 6 (six) hours as needed for cough (congestion).         Marland Kitchen ipratropium-albuterol (DUONEB) 0.5-2.5 (3) MG/3ML SOLN   Nebulization   Take 3 mLs by nebulization every 6 (six) hours as needed (wheezing).         . isosorbide mononitrate (IMDUR) 30 MG 24 hr tablet   Oral   Take 30 mg by mouth daily.         Marland Kitchen lisinopril (PRINIVIL,ZESTRIL) 2.5 MG tablet   Oral   Take 2.5 mg by mouth daily.         Marland Kitchen menthol-cetylpyridinium (CEPACOL) 3 MG lozenge   Oral   Take 1 lozenge by mouth every 4 (four) hours as needed for sore throat.         . metoCLOPramide (REGLAN) 5 MG tablet   Oral   Take 5 mg by mouth 2 (two) times daily.         . metolazone (ZAROXOLYN) 2.5 MG tablet   Oral   Take 2.5 mg by mouth 3 (three) times a week. Monday, Wednesday, Friday         . nitroGLYCERIN (NITROSTAT) 0.4 MG SL tablet   Sublingual   Place 0.4 mg under the tongue every 5 (five) minutes as needed for chest pain.         Marland Kitchen ondansetron (ZOFRAN) 4 MG tablet   Oral   Take 4 mg by mouth every 6 (six) hours as needed for nausea or vomiting.         . pantoprazole (PROTONIX) 40 MG tablet   Oral   Take 40 mg by mouth 2 (two) times daily.         . polyethylene glycol (MIRALAX) packet   Oral   Take 17 g by mouth daily.          Marland Kitchen  potassium chloride (K-DUR,KLOR-CON) 10 MEQ tablet   Oral   Take 20 mEq by mouth 3 (three) times daily.         . Pramox-PE-Glycerin-Petrolatum (PREPARATION H)  1-0.25-14.4-15 % CREA   Rectal   Place 1 application rectally every 8 (eight) hours as needed (hemorrhoids).         . Probiotic Product (ALIGN) 4 MG CAPS   Oral   Take 1 capsule by mouth daily.         Marland Kitchen senna-docusate (SENOKOT-S) 8.6-50 MG tablet   Oral   Take 1 tablet by mouth at bedtime.         . sertraline (ZOLOFT) 100 MG tablet   Oral   Take 100 mg by mouth daily.         . sodium phosphate (FLEET) enema   Rectal   Place 1 enema rectally daily as needed (constipation). follow package directions         . sucralfate (CARAFATE) 1 G tablet   Oral   Take 1 g by mouth 2 (two) times daily.         Marland Kitchen torsemide (DEMADEX) 20 MG tablet   Oral   Take 40 mg by mouth daily.         Marland Kitchen amLODipine-benazepril (LOTREL) 10-40 MG per capsule   Oral   Take 1 capsule by mouth daily. Patient not taking: Reported on 11/24/2014   90 capsule   3   . furosemide (LASIX) 20 MG tablet      Take one pill by mouth once weekly to help with leg swelling Patient not taking: Reported on 11/24/2014   30 tablet   0   . potassium chloride SA (K-DUR,KLOR-CON) 20 MEQ tablet      1/2 pill by mouth once daily   45 tablet   3     Allergies Aspirin; Celebrex; Detrol; Rosuvastatin; and Simvastatin  Family History  Problem Relation Age of Onset  . Hypertension Mother   . Kidney disease Mother   . Heart disease Father     MI  . Alzheimer's disease Brother   . Cancer Paternal Uncle     esophageal    Social History Social History  Substance Use Topics  . Smoking status: Never Smoker   . Smokeless tobacco: Never Used  . Alcohol Use: Yes     Comment: Very rare    Review of Systems  Constitutional: Negative for fever. Eyes: Negative for visual changes. ENT: Negative for sore throat.Positive head injury Cardiovascular: Negative for chest pain. Respiratory: Negative for shortness of breath. Gastrointestinal: Negative for abdominal pain, negative for vomiting and  diarrhea. Genitourinary: Negative for dysuria. Musculoskeletal: Negative for back pain. Skin: Negative for rash. Neurological: Negative for headaches, focal weakness or numbness.   10-point ROS otherwise negative.  ____________________________________________   PHYSICAL EXAM:  VITAL SIGNS: ED Triage Vitals  Enc Vitals Group     BP 11/24/14 0250 143/110 mmHg     Pulse Rate 11/24/14 0250 76     Resp 11/24/14 0250 17     Temp 11/24/14 0250 97.7 F (36.5 C)     Temp Source 11/24/14 0250 Oral     SpO2 11/24/14 0250 98 %     Weight 11/24/14 0250 125 lb (56.7 kg)     Height 11/24/14 0250 5\' 4"  (1.626 m)     Head Cir --      Peak Flow --      Pain Score 11/24/14 0251 8  Pain Loc --      Pain Edu? --      Excl. in [place]? --      Constitutional: Alert and oriented. Well appearing and in no distress. Eyes: Conjunctivae are normal. PERRL. Normal extraocular movements. ENT   Head: Normocephalic, left temporal contusion   Nose: No congestion/rhinnorhea.   Mouth/Throat: Mucous membranes are moist.   Neck: No stridor. Cardiovascular: Normal rate, regular rhythm. Normal and symmetric distal pulses are present in all extremities. No murmurs, rubs, or gallops. Respiratory: Normal respiratory effort without tachypnea nor retractions. Breath sounds are clear and equal bilaterally. No wheezes/rales/rhonchi. Gastrointestinal: Soft and nontender. No distention. There is no CVA tenderness. Genitourinary: deferred Musculoskeletal: Nontender with normal range of motion in all extremities. No joint effusions.  No lower extremity tenderness nor edema. Neurologic:  Normal speech and language. No gross focal neurologic deficits are appreciated. Speech is normal.  Skin:  Skin is warm, dry and intact. No rash noted. Psychiatric: Mood and affect are normal. Speech and behavior are normal. Patient exhibits appropriate insight and judgment.  ____________________________________________     LABS (pertinent positives/negatives)  Labs Reviewed  CBC WITH DIFFERENTIAL/PLATELET - Abnormal; Notable for the following:    RBC 3.62 (*)    Hemoglobin 11.4 (*)    HCT 34.4 (*)    Neutro Abs 6.6 (*)    Lymphs Abs 0.5 (*)    All other components within normal limits  COMPREHENSIVE METABOLIC PANEL - Abnormal; Notable for the following:    Glucose, Bld 128 (*)    BUN 44 (*)    Creatinine, Ser 1.71 (*)    Total Protein 6.2 (*)    GFR calc non Af Amer 25 (*)    GFR calc Af Amer 29 (*)    All other components within normal limits  PROTIME-INR  URINALYSIS COMPLETEWITH MICROSCOPIC ([facility] ONLY)         RADIOLOGY    CT Cervical Spine Wo Contrast (Final result) Result time: 11/24/14 04:08:30   Final result by Rad Results In Interface (11/24/14 04:08:30)   Narrative:   CLINICAL DATA: Initial evaluation for acute trauma, fall.  EXAM: CT HEAD WITHOUT CONTRAST  CT CERVICAL SPINE WITHOUT CONTRAST  TECHNIQUE: Multidetector CT imaging of the head and cervical spine was performed following the standard protocol without intravenous contrast. Multiplanar CT image reconstructions of the cervical spine were also generated.  COMPARISON: None.  FINDINGS: CT HEAD FINDINGS  Soft tissue contusion/laceration present at the left frontotemporal scalp and periorbital region. No radiopaque foreign body. Scalp soft tissues otherwise unremarkable. No acute abnormality about the orbits.  Paranasal sinuses are clear. Mastoid air cells are clear. Middle ear cavities are clear. No calvarial fracture.  Diffuse prominence of the CSF containing spaces is compatible with age-related cerebral atrophy. Patchy hypodensity within the periventricular and deep white matter most consistent with chronic small vessel ischemic disease. Vascular calcifications within the carotid siphons.  No acute large vessel territory infarct. No intracranial hemorrhage. No mass lesion, midline shift or mass  effect. Ventricular prominence related to global parenchymal volume loss present without hydrocephalus. No extra-axial fluid collection.  CT CERVICAL SPINE FINDINGS  Straightening of the normal cervical lordosis. 3 mm anterolisthesis of C2 on C3 is stable. Additionally, 3 mm of anterolisthesis of C4 on C5 is also unchanged. Trace anterolisthesis of C7 on T1, unchanged. Normal C1-2 articulations are preserved. Dens is intact. No acute fracture or traumatic malalignment. Prevertebral soft tissues within normal limits.  Advanced degenerative changes about the  dens. Advanced degenerative spondylolysis at C3-4 through C6-7.  No acute soft tissue abnormality within the visualized neck. Prominent vascular calcifications about the carotid bifurcations. Bilateral pleural effusions are partially visualized, right greater than left. Diffuse interlobular septal thickening suggestive of mild diffuse pulmonary interstitial edema. No apical pneumothorax.  IMPRESSION: CT BRAIN:  1. No acute intracranial process. 2. Left frontotemporal and periorbital scalp contusion with laceration. 3. Advanced cerebral atrophy with Mild chronic small vessel ischemic disease.  CT CERVICAL SPINE:  1. No acute traumatic injury within the cervical spine. 2. Multilevel degenerative spondylolysis with associated subluxations, stable as compared to previous. 3. Bilateral pleural effusions, right greater than left. Diffuse interlobular septal thickening within the visualized lung apices suggestive of interstitial pulmonary edema. Correlation with plain film radiography recommended.   Electronically Signed By: Jeannine Boga M.D. On: 11/24/2014 04:08          CT Shoulder Left Wo Contrast (Final result) Result time: 11/24/14 03:46:53   Final result by Rad Results In Interface (11/24/14 03:46:53)   Narrative:   CLINICAL DATA: Left shoulder pain after unwitnessed fall.  EXAM: CT OF THE  LEFT SHOULDER WITHOUT CONTRAST  TECHNIQUE: Multidetector CT imaging was performed according to the standard protocol. Multiplanar CT image reconstructions were also generated.  COMPARISON: None.  FINDINGS: No acute fracture or dislocation. Severe glenohumeral osteoarthritis with near complete joint space loss, subchondral cystic change, osteophytes and osseous remottling. Chronic rotator cuff tear with superior subluxation of humeral head abutting the undersurface of the acromion and fatty infiltration and atrophy of supraspinatus muscle. Periarticular bursal fluid collection and small joint effusion. Left ribs are intact. There is a small left pleural effusion. Atherosclerosis noted of the thoracic aorta which tortuous. Mild smooth septal thickening and peribronchial thickening noted.  IMPRESSION: 1. No acute fracture dislocation. 2. Advanced chronic degenerative change of the left shoulder. 3. Incidental note of left pleural effusion in the included chest.   Electronically Signed By: Jeb Levering M.D. On: 11/24/2014 03:46           INITIAL IMPRESSION / ASSESSMENT AND PLAN / ED COURSE  Pertinent labs & imaging results that were available during my care of the patient were reviewed by me and considered in my medical decision making (see chart for details).    ____________________________________________   FINAL CLINICAL IMPRESSION(S) / ED DIAGNOSES  Final diagnoses:  Pleural effusion      Gregor Hams, MD 11/27/14 [phone]

## 2014-11-24 NOTE — H&P (Addendum)
Sherri CHISOLM is an 81 y.o. female.   Chief Complaint: Fall HPI: The patient presents emergency department via EMS from her nursing home where she suffered a fall. Apparently the patient had gotten up in the middle the night to use the bathroom. She has no recollection of the events prior to the fall. Currently she complains of pain on the left side of her skull she has a significant amount of bruising. Otherwise she feels relatively well. The patient has a history of falls and worsening dementia. CT head shows no intracranial bleed however chest x-ray shows new interstitial edema which prompted the emergency department staff to call for admission.  Past Medical History  Diagnosis Date  . Constipation     chronic  . Asthma   . Heart murmur   . Scoliosis     severe, affects her digestion, GI function- chronic pain  . AR (allergic rhinitis)   . Palpitations   . Hyperlipidemia   . Hypertension   . Pneumonia     history of  . DDD (degenerative disc disease)     with injections  . Osteoarthritis     s/p right hip replacement  . Renal insufficiency   . CAD (coronary artery disease)     non obstructive. Cath 2008 with medical treatment    Past Surgical History  Procedure Laterality Date  . Abdominal hysterectomy    . Foot surgery    . Carpal tunnel release      left  . Back surgery  1999    herniated disk  . Colon surgery    . Hernia repair  2012    left inquinal, complicated by large seroma  . Tonsillectomy    . Appendectomy    . Hemorrhoid surgery    . Coronary angioplasty  2008    LAD 40%LCX okRCA 30% EF 60%  . Bladder tack    . Total hip arthroplasty  2009  . Cataract extraction  2010  . Abdominal surgery      benign fatty tumors, also took bilateral ovaries and gallbladder    Family History  Problem Relation Age of Onset  . Hypertension Mother   . Kidney disease Mother   . Heart disease Father     MI  . Alzheimer's disease Brother   . Cancer Paternal Uncle      esophageal   Social History:  reports that she has never smoked. She has never used smokeless tobacco. She reports that she drinks alcohol. She reports that she does not use illicit drugs.  Allergies:  Allergies  Allergen Reactions  . Aspirin   . Celebrex [Celecoxib] Nausea Only  . Detrol [Tolterodine Tartrate]     REACTION: Not effective  . Rosuvastatin     REACTION: Severe leg edema  . Simvastatin     REACTION: Feet burn    Prior to Admission medications   Medication Sig Start Date End Date Taking? Authorizing Provider  acetaminophen (TYLENOL) 325 MG tablet Take 650 mg by mouth every 4 (four) hours as needed for moderate pain.    Yes Historical Provider, MD  ALPRAZolam (XANAX) 0.25 MG tablet Take 0.25 mg by mouth 3 (three) times daily as needed for anxiety.   Yes Historical Provider, MD  aluminum-magnesium hydroxide-simethicone (MAALOX) 893-734-28 MG/5ML SUSP Take 30 mLs by mouth every 4 (four) hours as needed (dyspepsia).   Yes Historical Provider, MD  apixaban (ELIQUIS) 2.5 MG TABS tablet Take 2.5 mg by mouth 2 (two) times daily.  Yes Historical Provider, MD  bisacodyl (DULCOLAX) 10 MG suppository Place 10 mg rectally as needed for moderate constipation.   Yes Historical Provider, MD  budesonide-formoterol (SYMBICORT) 80-4.5 MCG/ACT inhaler Inhale 2 puffs into the lungs 2 (two) times daily.   Yes Historical Provider, MD  calcium carbonate (TUMS - DOSED IN MG ELEMENTAL CALCIUM) 500 MG chewable tablet Chew 2 tablets by mouth every 6 (six) hours as needed for indigestion or heartburn.   Yes Historical Provider, MD  carvedilol (COREG) 6.25 MG tablet Take 6.25 mg by mouth 2 (two) times daily with a meal.   Yes Historical Provider, MD  Cholecalciferol (VITAMIN D3) 50000 UNITS CAPS Take 1 capsule by mouth every 28 (twenty-eight) days.   Yes Historical Provider, MD  ferrous sulfate 325 (65 FE) MG tablet Take 325 mg by mouth daily with breakfast.   Yes Historical Provider, MD  folic acid  (FOLVITE) 993 MCG tablet Take 400 mcg by mouth daily.   Yes Historical Provider, MD  guaiFENesin-dextromethorphan (ROBITUSSIN DM) 100-10 MG/5ML syrup Take 10 mLs by mouth every 6 (six) hours as needed for cough (congestion).   Yes Historical Provider, MD  ipratropium-albuterol (DUONEB) 0.5-2.5 (3) MG/3ML SOLN Take 3 mLs by nebulization every 6 (six) hours as needed (wheezing).   Yes Historical Provider, MD  isosorbide mononitrate (IMDUR) 30 MG 24 hr tablet Take 30 mg by mouth daily.   Yes Historical Provider, MD  lisinopril (PRINIVIL,ZESTRIL) 2.5 MG tablet Take 2.5 mg by mouth daily.   Yes Historical Provider, MD  menthol-cetylpyridinium (CEPACOL) 3 MG lozenge Take 1 lozenge by mouth every 4 (four) hours as needed for sore throat.   Yes Historical Provider, MD  metoCLOPramide (REGLAN) 5 MG tablet Take 5 mg by mouth 2 (two) times daily.   Yes Historical Provider, MD  metolazone (ZAROXOLYN) 2.5 MG tablet Take 2.5 mg by mouth 3 (three) times a week. Monday, Wednesday, Friday   Yes Historical Provider, MD  nitroGLYCERIN (NITROSTAT) 0.4 MG SL tablet Place 0.4 mg under the tongue every 5 (five) minutes as needed for chest pain.   Yes Historical Provider, MD  ondansetron (ZOFRAN) 4 MG tablet Take 4 mg by mouth every 6 (six) hours as needed for nausea or vomiting.   Yes Historical Provider, MD  pantoprazole (PROTONIX) 40 MG tablet Take 40 mg by mouth 2 (two) times daily.   Yes Historical Provider, MD  polyethylene glycol (MIRALAX) packet Take 17 g by mouth daily.    Yes Historical Provider, MD  potassium chloride (K-DUR,KLOR-CON) 10 MEQ tablet Take 20 mEq by mouth 3 (three) times daily.   Yes Historical Provider, MD  Pramox-PE-Glycerin-Petrolatum (PREPARATION H) 1-0.25-14.4-15 % CREA Place 1 application rectally every 8 (eight) hours as needed (hemorrhoids).   Yes Historical Provider, MD  Probiotic Product (ALIGN) 4 MG CAPS Take 1 capsule by mouth daily.   Yes Historical Provider, MD  senna-docusate  (SENOKOT-S) 8.6-50 MG tablet Take 1 tablet by mouth at bedtime.   Yes Historical Provider, MD  sertraline (ZOLOFT) 100 MG tablet Take 100 mg by mouth daily.   Yes Historical Provider, MD  sodium phosphate (FLEET) enema Place 1 enema rectally daily as needed (constipation). follow package directions   Yes Historical Provider, MD  sucralfate (CARAFATE) 1 G tablet Take 1 g by mouth 2 (two) times daily.   Yes Historical Provider, MD  torsemide (DEMADEX) 20 MG tablet Take 40 mg by mouth daily.   Yes Historical Provider, MD  amLODipine-benazepril (LOTREL) 10-40 MG per capsule Take 1  capsule by mouth daily. Patient not taking: Reported on 11/24/2014 09/14/12   Abner Greenspan, MD  furosemide (LASIX) 20 MG tablet Take one pill by mouth once weekly to help with leg swelling Patient not taking: Reported on 11/24/2014 07/10/11   Abner Greenspan, MD  potassium chloride SA (K-DUR,KLOR-CON) 20 MEQ tablet 1/2 pill by mouth once daily 12/10/10   Abner Greenspan, MD     Results for orders placed or performed during the hospital encounter of 11/24/14 (from the past 48 hour(s))  CBC with Differential     Status: Abnormal   Collection Time: 11/24/14  3:03 AM  Result Value Ref Range   WBC 7.9 3.6 - 11.0 K/uL   RBC 3.62 (L) 3.80 - 5.20 MIL/uL   Hemoglobin 11.4 (L) 12.0 - 16.0 g/dL   HCT 34.4 (L) 35.0 - 47.0 %   MCV 95.0 80.0 - 100.0 fL   MCH 31.3 26.0 - 34.0 pg   MCHC 33.0 32.0 - 36.0 g/dL   RDW 14.4 11.5 - 14.5 %   Platelets 199 150 - 440 K/uL   Neutrophils Relative % 83 %   Neutro Abs 6.6 (H) 1.4 - 6.5 K/uL   Lymphocytes Relative 6 %   Lymphs Abs 0.5 (L) 1.0 - 3.6 K/uL   Monocytes Relative 7 %   Monocytes Absolute 0.6 0.2 - 0.9 K/uL   Eosinophils Relative 3 %   Eosinophils Absolute 0.2 0 - 0.7 K/uL   Basophils Relative 1 %   Basophils Absolute 0.0 0 - 0.1 K/uL  Comprehensive metabolic panel     Status: Abnormal   Collection Time: 11/24/14  3:03 AM  Result Value Ref Range   Sodium 138 135 - 145 mmol/L    Potassium 4.3 3.5 - 5.1 mmol/L   Chloride 106 101 - 111 mmol/L   CO2 27 22 - 32 mmol/L   Glucose, Bld 128 (H) 65 - 99 mg/dL   BUN 44 (H) 6 - 20 mg/dL   Creatinine, Ser 1.71 (H) 0.44 - 1.00 mg/dL   Calcium 9.2 8.9 - 10.3 mg/dL   Total Protein 6.2 (L) 6.5 - 8.1 g/dL   Albumin 3.5 3.5 - 5.0 g/dL   AST 20 15 - 41 U/L   ALT 17 14 - 54 U/L   Alkaline Phosphatase 73 38 - 126 U/L   Total Bilirubin 0.5 0.3 - 1.2 mg/dL   GFR calc non Af Amer 25 (L) >60 mL/min   GFR calc Af Amer 29 (L) >60 mL/min    Comment: (NOTE) The eGFR has been calculated using the CKD EPI equation. This calculation has not been validated in all clinical situations. eGFR's persistently <60 mL/min signify possible Chronic Kidney Disease.    Anion gap 5 5 - 15  Protime-INR     Status: None   Collection Time: 11/24/14  3:03 AM  Result Value Ref Range   Prothrombin Time 14.6 11.4 - 15.0 seconds   INR 1.12    Ct Head Wo Contrast  11/24/2014  CLINICAL DATA:  Initial evaluation for acute trauma, fall. EXAM: CT HEAD WITHOUT CONTRAST CT CERVICAL SPINE WITHOUT CONTRAST TECHNIQUE: Multidetector CT imaging of the head and cervical spine was performed following the standard protocol without intravenous contrast. Multiplanar CT image reconstructions of the cervical spine were also generated. COMPARISON:  None. FINDINGS: CT HEAD FINDINGS Soft tissue contusion/laceration present at the left frontotemporal scalp and periorbital region. No radiopaque foreign body. Scalp soft tissues otherwise unremarkable. No acute abnormality about  the orbits. Paranasal sinuses are clear. Mastoid air cells are clear. Middle ear cavities are clear. No calvarial fracture. Diffuse prominence of the CSF containing spaces is compatible with age-related cerebral atrophy. Patchy hypodensity within the periventricular and deep white matter most consistent with chronic small vessel ischemic disease. Vascular calcifications within the carotid siphons. No acute large  vessel territory infarct. No intracranial hemorrhage. No mass lesion, midline shift or mass effect. Ventricular prominence related to global parenchymal volume loss present without hydrocephalus. No extra-axial fluid collection. CT CERVICAL SPINE FINDINGS Straightening of the normal cervical lordosis. 3 mm anterolisthesis of C2 on C3 is stable. Additionally, 3 mm of anterolisthesis of C4 on C5 is also unchanged. Trace anterolisthesis of C7 on T1, unchanged. Normal C1-2 articulations are preserved. Dens is intact. No acute fracture or traumatic malalignment. Prevertebral soft tissues within normal limits. Advanced degenerative changes about the dens. Advanced degenerative spondylolysis at C3-4 through C6-7. No acute soft tissue abnormality within the visualized neck. Prominent vascular calcifications about the carotid bifurcations. Bilateral pleural effusions are partially visualized, right greater than left. Diffuse interlobular septal thickening suggestive of mild diffuse pulmonary interstitial edema. No apical pneumothorax. IMPRESSION: CT BRAIN: 1. No acute intracranial process. 2. Left frontotemporal and periorbital scalp contusion with laceration. 3. Advanced cerebral atrophy with Mild chronic small vessel ischemic disease. CT CERVICAL SPINE: 1. No acute traumatic injury within the cervical spine. 2. Multilevel degenerative spondylolysis with associated subluxations, stable as compared to previous. 3. Bilateral pleural effusions, right greater than left. Diffuse interlobular septal thickening within the visualized lung apices suggestive of interstitial pulmonary edema. Correlation with plain film radiography recommended. Electronically Signed   By: Jeannine Boga M.D.   On: 11/24/2014 04:08   Ct Cervical Spine Wo Contrast  11/24/2014  CLINICAL DATA:  Initial evaluation for acute trauma, fall. EXAM: CT HEAD WITHOUT CONTRAST CT CERVICAL SPINE WITHOUT CONTRAST TECHNIQUE: Multidetector CT imaging of the  head and cervical spine was performed following the standard protocol without intravenous contrast. Multiplanar CT image reconstructions of the cervical spine were also generated. COMPARISON:  None. FINDINGS: CT HEAD FINDINGS Soft tissue contusion/laceration present at the left frontotemporal scalp and periorbital region. No radiopaque foreign body. Scalp soft tissues otherwise unremarkable. No acute abnormality about the orbits. Paranasal sinuses are clear. Mastoid air cells are clear. Middle ear cavities are clear. No calvarial fracture. Diffuse prominence of the CSF containing spaces is compatible with age-related cerebral atrophy. Patchy hypodensity within the periventricular and deep white matter most consistent with chronic small vessel ischemic disease. Vascular calcifications within the carotid siphons. No acute large vessel territory infarct. No intracranial hemorrhage. No mass lesion, midline shift or mass effect. Ventricular prominence related to global parenchymal volume loss present without hydrocephalus. No extra-axial fluid collection. CT CERVICAL SPINE FINDINGS Straightening of the normal cervical lordosis. 3 mm anterolisthesis of C2 on C3 is stable. Additionally, 3 mm of anterolisthesis of C4 on C5 is also unchanged. Trace anterolisthesis of C7 on T1, unchanged. Normal C1-2 articulations are preserved. Dens is intact. No acute fracture or traumatic malalignment. Prevertebral soft tissues within normal limits. Advanced degenerative changes about the dens. Advanced degenerative spondylolysis at C3-4 through C6-7. No acute soft tissue abnormality within the visualized neck. Prominent vascular calcifications about the carotid bifurcations. Bilateral pleural effusions are partially visualized, right greater than left. Diffuse interlobular septal thickening suggestive of mild diffuse pulmonary interstitial edema. No apical pneumothorax. IMPRESSION: CT BRAIN: 1. No acute intracranial process. 2. Left  frontotemporal and periorbital scalp contusion with  laceration. 3. Advanced cerebral atrophy with Mild chronic small vessel ischemic disease. CT CERVICAL SPINE: 1. No acute traumatic injury within the cervical spine. 2. Multilevel degenerative spondylolysis with associated subluxations, stable as compared to previous. 3. Bilateral pleural effusions, right greater than left. Diffuse interlobular septal thickening within the visualized lung apices suggestive of interstitial pulmonary edema. Correlation with plain film radiography recommended. Electronically Signed   By: Jeannine Boga M.D.   On: 11/24/2014 04:08   Ct Shoulder Left Wo Contrast  11/24/2014  CLINICAL DATA:  Left shoulder pain after unwitnessed fall. EXAM: CT OF THE LEFT SHOULDER WITHOUT CONTRAST TECHNIQUE: Multidetector CT imaging was performed according to the standard protocol. Multiplanar CT image reconstructions were also generated. COMPARISON:  None. FINDINGS: No acute fracture or dislocation. Severe glenohumeral osteoarthritis with near complete joint space loss, subchondral cystic change, osteophytes and osseous remottling. Chronic rotator cuff tear with superior subluxation of humeral head abutting the undersurface of the acromion and fatty infiltration and atrophy of supraspinatus muscle. Periarticular bursal fluid collection and small joint effusion. Left ribs are intact. There is a small left pleural effusion. Atherosclerosis noted of the thoracic aorta which tortuous. Mild smooth septal thickening and peribronchial thickening noted. IMPRESSION: 1. No acute fracture dislocation. 2. Advanced chronic degenerative change of the left shoulder. 3. Incidental note of left pleural effusion in the included chest. Electronically Signed   By: Jeb Levering M.D.   On: 11/24/2014 03:46   Dg Chest Portable 1 View  11/24/2014  CLINICAL DATA:  Initial valuation for bilateral pleural effusions. EXAM: PORTABLE CHEST 1 VIEW COMPARISON:  None.  FINDINGS: Moderate cardiomegaly present. Atheromatous plaque present within the aortic arch. Mediastinal silhouette within normal limits. Lungs are normally inflated. Small layering bilateral pleural effusions are present. There is mild diffuse pulmonary vascular congestion with interstitial prominence, suggesting mild diffuse pulmonary interstitial edema. No pneumothorax. No acute osseus abnormality. Severe degenerative changes about the shoulders bilaterally. IMPRESSION: 1. Cardiomegaly with diffuse pulmonary vascular congestion with indistinctness of the interstitial markings, suggesting mild diffuse pulmonary interstitial edema. 2. Small bilateral pleural effusions. Electronically Signed   By: Jeannine Boga M.D.   On: 11/24/2014 05:00    Review of Systems  Constitutional: Negative for fever and chills.  HENT: Negative for sore throat and tinnitus.        Jaw/facial pain  Eyes: Negative for blurred vision and redness.  Respiratory: Negative for cough and shortness of breath.   Cardiovascular: Negative for chest pain, palpitations, orthopnea and PND.  Gastrointestinal: Negative for nausea, vomiting, abdominal pain and diarrhea.  Genitourinary: Negative for dysuria, urgency and frequency.  Musculoskeletal: Positive for falls. Negative for myalgias and joint pain.  Skin: Negative for rash.       No lesions  Neurological: Negative for speech change, focal weakness and weakness.  Endo/Heme/Allergies: Bruises/bleeds easily.       No temperature intolerance  Psychiatric/Behavioral: Negative for depression and suicidal ideas.    Blood pressure 144/76, pulse 65, temperature 97.7 F (36.5 C), temperature source Oral, resp. rate 28, height _0  (1.626 m), weight 56.7 kg (125 lb), SpO2 95 %. Physical Exam  Nursing note and vitals reviewed. Constitutional: She is oriented to person, place, and time. She appears well-developed and well-nourished. No distress.  HENT:  Head: Normocephalic and  atraumatic.  Mouth/Throat: Oropharynx is clear and moist.  Left ear pain radiating to zygomatic arch when opening jaw  Eyes: EOM are normal. Pupils are equal, round, and reactive to light.  Neck: Normal range  of motion. No tracheal deviation present. No thyromegaly present.  Cardiovascular: Normal rate, regular rhythm and normal heart sounds.  Exam reveals no gallop and no friction rub.   No murmur heard. Respiratory: Effort normal and breath sounds normal.  GI: Soft. Bowel sounds are normal. She exhibits no distension. There is no tenderness.  Genitourinary:  Deferred  Musculoskeletal: Normal range of motion. She exhibits edema and tenderness (L shin).  Lymphadenopathy:    She has no cervical adenopathy.  Neurological: She is alert and oriented to person, place, and time. No cranial nerve deficit. She exhibits normal muscle tone.  Skin: Skin is warm and dry.  Large area of ecchymosis and facial bruising left temporal area  Psychiatric: She has a normal mood and affect. Judgment and thought content normal.     Assessment/Plan This is a 81 year old Caucasian female admitted for syncope, acute on chronic kidney injury and interstitial pulmonary edema. 1. Syncope: Unclear etiology as the patient has no recollection of the events preceding her fall. This is happened many times before. The patient has a history of A. fib and while at bedside one can observe short runs of rapid ventricular response. The patient also admits to difficulty standing which presumably contributes to her tendency to fall. Also of note, the patient admits to drinking very little throughout the day as she is reluctant to ask for help going to the bathroom and strives to maintain her independence. Likely etiology is thus generalized weakness and/or orthostatic hypotension. Continue to monitor telemetry. 2. Acute on chronic kidney injury: The patient is intravascularly dry. I have placed her on gentle intravenous hydration.  Avoid nephrotoxic agents.  3. A. fib: Occasional RVR; continue Eliquis and carvedilol. 4. Congestive heart failure: Presumably diastolic. Last echo in 2010 shows preserved ejection fraction. The patient is on Zaroxolyn as well as torsemide. Her daughter states that the patient's weight gain has been up to 50 pounds in the past prior to initiation of these diuretics. However if the patient is going to insist on ambulating and limiting her by mouth intake due to fear of incontinence decreasing or discontinuing one of her diuretics may be helpful. Cardiology consultation and echocardiogram ordered. 5. Hypertension: Continue lisinopril.  6. Coronary artery disease: Stable; nonobstructive. Continue isosorbide mononitrate 7. Jaw pain: upper jaw/left ear pain when the patient widely opens her jaw.  Possible zygomatic or temporal fracture (my read).  Discuss further with radiology although likely needs no intervention. 8. DVT prophylaxis: Patient is started on full dose anticoagulation with Eliquis 9. GI prophylaxis: None The patient is a DO NOT RESUSCITATE. Time spent on admission was inpatient care possibly 35 minutes.  Harrie Foreman 11/24/2014, 7:25 AM

## 2014-11-24 NOTE — ED Notes (Signed)
Patient presents to Emergency Department via EMS with complaints of unwitnessed fall.  Pt from [employer], pt alert and was oriented for EMS but now unable to remember month, year or name of president.  Pt is on Eliquis 2.5mg  daily.  Hx of nonspecific dementia.   Pt has laceration above left eye (bandged), c/o of left shoulder and neck pain. Hx of shoulder surgery.

## 2014-11-24 NOTE — Progress Notes (Signed)
Skin verified by Estill Dooms., RN

## 2014-11-24 NOTE — ED Notes (Signed)
Redrawn lavender top due to last label not printing patient name, resent to lab

## 2014-11-24 NOTE — Progress Notes (Signed)
[place] at [facility]                                                                                                                                                                                            Patient Demographics   [name], is a 81 y.o. female, DOB - 01/29/1921, BOF:[id number]  Admit date - 11/24/2014   Admitting Physician Harrie Foreman, MD  Outpatient Primary MD for the patient is Marden Noble, MD   LOS - 1day  Subjective: Patient presented with syncope and collapse. She reports that she has had similar episodes like this before. Does complaint of feeling dizziness with standing.     Review of Systems:   CONSTITUTIONAL: No documented fever. No fatigue, weakness. No weight gain, no weight loss.  EYES: No blurry or double vision.  ENT: No tinnitus. No postnasal drip. No redness of the oropharynx.  RESPIRATORY: No cough, no wheeze, no hemoptysis. No dyspnea.  CARDIOVASCULAR: No chest pain. No orthopnea. No palpitations. No syncope.  GASTROINTESTINAL: No nausea, no vomiting or diarrhea. No abdominal pain. No melena or hematochezia.  GENITOURINARY: No dysuria or hematuria.  ENDOCRINE: No polyuria or nocturia. No heat or cold intolerance.  HEMATOLOGY: No anemia. No bruising. No bleeding.  INTEGUMENTARY: No rashes. No lesions.  MUSCULOSKELETAL: No arthritis. No swelling. No gout.  NEUROLOGIC: No numbness, tingling, or ataxia. No seizure-type activity.  PSYCHIATRIC: No anxiety. No insomnia. No ADD.    Vitals:   Filed Vitals:   11/24/14 0630 11/24/14 0700 11/24/14 0745 11/24/14 1210  BP: 138/80 144/76 155/83 146/69  Pulse: 65  75 66  Temp:   97.9 F (36.6 C) 98 F (36.7 C)  TempSrc:   Oral   Resp: 18 28 18 18   Height:   5\' 4"  (1.626 m)   Weight:   59.829 kg (131 lb 14.4 oz)   SpO2: 95%  99% 96%    Wt Readings from Last 3 Encounters:  11/24/14 59.829 kg (131 lb 14.4 oz)  09/14/12 59.194 kg (130 lb 8 oz)   10/16/11 57.04 kg (125 lb 12 oz)     Intake/Output Summary (Last 24 hours) at 11/24/14 1218 Last data filed at 11/24/14 0830  Gross per 24 hour  Intake    360 ml  Output    100 ml  Net    260 ml    Physical Exam:   GENERAL: Pleasant-appearing in no apparent distress.  HEAD, EYES, EARS, NOSE AND THROAT: Atraumatic, normocephalic. Extraocular muscles are intact. Pupils equal and reactive to light. Sclerae anicteric. No conjunctival injection. No oro-pharyngeal erythema.  NECK: Supple. There is no jugular venous distention. No bruits, no lymphadenopathy, no thyromegaly.  HEART: Regular rate and rhythm,. No murmurs, no rubs, no clicks.  LUNGS: Clear to auscultation bilaterally. No rales or rhonchi. No wheezes.  ABDOMEN: Soft, flat, nontender, nondistended. Has good bowel sounds. No hepatosplenomegaly appreciated.  EXTREMITIES: No evidence of any cyanosis, clubbing, or peripheral edema.  +2 pedal and radial pulses bilaterally.  NEUROLOGIC: The patient is alert, awake, and oriented x3 with no focal motor or sensory deficits appreciated bilaterally.  SKIN: Moist and warm with no rashes appreciated.  Psych: Not anxious, depressed LN: No inguinal LN enlargement    Antibiotics   Anti-infectives    None      Medications   Scheduled Meds: . acidophilus  1 capsule Oral Daily  . apixaban  2.5 mg Oral BID  . budesonide-formoterol  2 puff Inhalation BID  . carvedilol  6.25 mg Oral BID WC  . ferrous sulfate  325 mg Oral Q breakfast  . folic acid  1 mg Oral Daily  . isosorbide mononitrate  30 mg Oral Daily  . lisinopril  2.5 mg Oral Daily  . metoCLOPramide  5 mg Oral BID  . [START ON 11/26/2014] metolazone  2.5 mg Oral Once per day on Mon Wed Fri  . pantoprazole  40 mg Oral BID  . polyethylene glycol  17 g Oral Daily  . potassium chloride  20 mEq Oral TID  . senna-docusate  1 tablet Oral QHS  . sertraline  100 mg Oral Daily  . sodium chloride  3 mL Intravenous Q12H  . sucralfate   1 g Oral BID  . torsemide  40 mg Oral Daily   Continuous Infusions: . sodium chloride 75 mL/hr at 11/24/14 0955   PRN Meds:.acetaminophen **OR** acetaminophen, ALPRAZolam, alum & mag hydroxide-simeth, bisacodyl, calcium carbonate, guaiFENesin-dextromethorphan, ipratropium-albuterol, menthol-cetylpyridinium, nitroGLYCERIN, ondansetron, phenylephrine-shark liver oil-mineral oil-petrolatum, sodium phosphate   Data Review:   Micro Results Recent Results (from the past 240 hour(s))  MRSA PCR Screening     Status: Abnormal   Collection Time: 11/24/14  9:46 AM  Result Value Ref Range Status   MRSA by PCR POSITIVE (A) NEGATIVE Final    Comment:        The GeneXpert MRSA Assay (FDA approved for NASAL specimens only), is one component of a comprehensive MRSA colonization surveillance program. It is not intended to diagnose MRSA infection nor to guide or monitor treatment for MRSA infections. CRITICAL RESULT CALLED TO, READ BACK BY AND VERIFIED WITH: VICTORIA JOAN AT 2409 ON 11/24/14 BY [facility]     Radiology Reports Ct Head Wo Contrast  11/24/2014  CLINICAL DATA:  Initial evaluation for acute trauma, fall. EXAM: CT HEAD WITHOUT CONTRAST CT CERVICAL SPINE WITHOUT CONTRAST TECHNIQUE: Multidetector CT imaging of the head and cervical spine was performed following the standard protocol without intravenous contrast. Multiplanar CT image reconstructions of the cervical spine were also generated. COMPARISON:  None. FINDINGS: CT HEAD FINDINGS Soft tissue contusion/laceration present at the left frontotemporal scalp and periorbital region. No radiopaque foreign body. Scalp soft tissues otherwise unremarkable. No acute abnormality about the orbits. Paranasal sinuses are clear. Mastoid air cells are clear. Middle ear cavities are clear. No calvarial fracture. Diffuse prominence of the CSF containing spaces is compatible with age-related cerebral atrophy. Patchy hypodensity within the periventricular and  deep white matter most consistent with chronic small vessel ischemic disease. Vascular calcifications within the carotid siphons. No acute large vessel territory infarct. No intracranial hemorrhage. No  mass lesion, midline shift or mass effect. Ventricular prominence related to global parenchymal volume loss present without hydrocephalus. No extra-axial fluid collection. CT CERVICAL SPINE FINDINGS Straightening of the normal cervical lordosis. 3 mm anterolisthesis of C2 on C3 is stable. Additionally, 3 mm of anterolisthesis of C4 on C5 is also unchanged. Trace anterolisthesis of C7 on T1, unchanged. Normal C1-2 articulations are preserved. Dens is intact. No acute fracture or traumatic malalignment. Prevertebral soft tissues within normal limits. Advanced degenerative changes about the dens. Advanced degenerative spondylolysis at C3-4 through C6-7. No acute soft tissue abnormality within the visualized neck. Prominent vascular calcifications about the carotid bifurcations. Bilateral pleural effusions are partially visualized, right greater than left. Diffuse interlobular septal thickening suggestive of mild diffuse pulmonary interstitial edema. No apical pneumothorax. IMPRESSION: CT BRAIN: 1. No acute intracranial process. 2. Left frontotemporal and periorbital scalp contusion with laceration. 3. Advanced cerebral atrophy with Mild chronic small vessel ischemic disease. CT CERVICAL SPINE: 1. No acute traumatic injury within the cervical spine. 2. Multilevel degenerative spondylolysis with associated subluxations, stable as compared to previous. 3. Bilateral pleural effusions, right greater than left. Diffuse interlobular septal thickening within the visualized lung apices suggestive of interstitial pulmonary edema. Correlation with plain film radiography recommended. Electronically Signed   By: Jeannine Boga M.D.   On: 11/24/2014 04:08   Ct Cervical Spine Wo Contrast  11/24/2014  CLINICAL DATA:  Initial  evaluation for acute trauma, fall. EXAM: CT HEAD WITHOUT CONTRAST CT CERVICAL SPINE WITHOUT CONTRAST TECHNIQUE: Multidetector CT imaging of the head and cervical spine was performed following the standard protocol without intravenous contrast. Multiplanar CT image reconstructions of the cervical spine were also generated. COMPARISON:  None. FINDINGS: CT HEAD FINDINGS Soft tissue contusion/laceration present at the left frontotemporal scalp and periorbital region. No radiopaque foreign body. Scalp soft tissues otherwise unremarkable. No acute abnormality about the orbits. Paranasal sinuses are clear. Mastoid air cells are clear. Middle ear cavities are clear. No calvarial fracture. Diffuse prominence of the CSF containing spaces is compatible with age-related cerebral atrophy. Patchy hypodensity within the periventricular and deep white matter most consistent with chronic small vessel ischemic disease. Vascular calcifications within the carotid siphons. No acute large vessel territory infarct. No intracranial hemorrhage. No mass lesion, midline shift or mass effect. Ventricular prominence related to global parenchymal volume loss present without hydrocephalus. No extra-axial fluid collection. CT CERVICAL SPINE FINDINGS Straightening of the normal cervical lordosis. 3 mm anterolisthesis of C2 on C3 is stable. Additionally, 3 mm of anterolisthesis of C4 on C5 is also unchanged. Trace anterolisthesis of C7 on T1, unchanged. Normal C1-2 articulations are preserved. Dens is intact. No acute fracture or traumatic malalignment. Prevertebral soft tissues within normal limits. Advanced degenerative changes about the dens. Advanced degenerative spondylolysis at C3-4 through C6-7. No acute soft tissue abnormality within the visualized neck. Prominent vascular calcifications about the carotid bifurcations. Bilateral pleural effusions are partially visualized, right greater than left. Diffuse interlobular septal thickening  suggestive of mild diffuse pulmonary interstitial edema. No apical pneumothorax. IMPRESSION: CT BRAIN: 1. No acute intracranial process. 2. Left frontotemporal and periorbital scalp contusion with laceration. 3. Advanced cerebral atrophy with Mild chronic small vessel ischemic disease. CT CERVICAL SPINE: 1. No acute traumatic injury within the cervical spine. 2. Multilevel degenerative spondylolysis with associated subluxations, stable as compared to previous. 3. Bilateral pleural effusions, right greater than left. Diffuse interlobular septal thickening within the visualized lung apices suggestive of interstitial pulmonary edema. Correlation with plain film radiography recommended. Electronically Signed  By: Jeannine Boga M.D.   On: 11/24/2014 04:08   Ct Shoulder Left Wo Contrast  11/24/2014  CLINICAL DATA:  Left shoulder pain after unwitnessed fall. EXAM: CT OF THE LEFT SHOULDER WITHOUT CONTRAST TECHNIQUE: Multidetector CT imaging was performed according to the standard protocol. Multiplanar CT image reconstructions were also generated. COMPARISON:  None. FINDINGS: No acute fracture or dislocation. Severe glenohumeral osteoarthritis with near complete joint space loss, subchondral cystic change, osteophytes and osseous remottling. Chronic rotator cuff tear with superior subluxation of humeral head abutting the undersurface of the acromion and fatty infiltration and atrophy of supraspinatus muscle. Periarticular bursal fluid collection and small joint effusion. Left ribs are intact. There is a small left pleural effusion. Atherosclerosis noted of the thoracic aorta which tortuous. Mild smooth septal thickening and peribronchial thickening noted. IMPRESSION: 1. No acute fracture dislocation. 2. Advanced chronic degenerative change of the left shoulder. 3. Incidental note of left pleural effusion in the included chest. Electronically Signed   By: Jeb Levering M.D.   On: 11/24/2014 03:46   Dg Chest  Portable 1 View  11/24/2014  CLINICAL DATA:  Initial valuation for bilateral pleural effusions. EXAM: PORTABLE CHEST 1 VIEW COMPARISON:  None. FINDINGS: Moderate cardiomegaly present. Atheromatous plaque present within the aortic arch. Mediastinal silhouette within normal limits. Lungs are normally inflated. Small layering bilateral pleural effusions are present. There is mild diffuse pulmonary vascular congestion with interstitial prominence, suggesting mild diffuse pulmonary interstitial edema. No pneumothorax. No acute osseus abnormality. Severe degenerative changes about the shoulders bilaterally. IMPRESSION: 1. Cardiomegaly with diffuse pulmonary vascular congestion with indistinctness of the interstitial markings, suggesting mild diffuse pulmonary interstitial edema. 2. Small bilateral pleural effusions. Electronically Signed   By: Jeannine Boga M.D.   On: 11/24/2014 05:00     CBC  Recent Labs Lab 11/24/14 0303  WBC 7.9  HGB 11.4*  HCT 34.4*  PLT 199  MCV 95.0  MCH 31.3  MCHC 33.0  RDW 14.4  LYMPHSABS 0.5*  MONOABS 0.6  EOSABS 0.2  BASOSABS 0.0    Chemistries   Recent Labs Lab 11/24/14 0303  NA 138  K 4.3  CL 106  CO2 27  GLUCOSE 128*  BUN 44*  CREATININE 1.71*  CALCIUM 9.2  AST 20  ALT 17  ALKPHOS 73  BILITOT 0.5   ------------------------------------------------------------------------------------------------------------------ estimated creatinine clearance is 18.1 mL/min (by C-G formula based on Cr of 1.71). ------------------------------------------------------------------------------------------------------------------ No results for input(s): HGBA1C in the last 72 hours. ------------------------------------------------------------------------------------------------------------------ No results for input(s): CHOL, HDL, LDLCALC, TRIG, CHOLHDL, LDLDIRECT in the last 72  hours. ------------------------------------------------------------------------------------------------------------------  Recent Labs  11/24/14 0303  TSH 3.616   ------------------------------------------------------------------------------------------------------------------ No results for input(s): VITAMINB12, FOLATE, FERRITIN, TIBC, IRON, RETICCTPCT in the last 72 hours.  Coagulation profile  Recent Labs Lab 11/24/14 0303  INR 1.12    No results for input(s): DDIMER in the last 72 hours.  Cardiac Enzymes No results for input(s): CKMB, TROPONINI, MYOGLOBIN in the last 168 hours.  Invalid input(s): CK ------------------------------------------------------------------------------------------------------------------ Invalid input(s): Alfarata   This is a 81 year old Caucasian female admitted for syncope, acute on chronic kidney injury and interstitial pulmonary edema. 1. Syncope: Likely due to orthostatic hypotension, we'll have the nurse check her blood pressure with standing. Continue telemetry monitoring. Echo results currently pending 2. Acute on chronic kidney injury: Patient appears to have some chronic kidney disease. Creatinine little worse. Low-dose fluid 3. A. fib: Occasional RVR; continue Eliquis and carvedilol. 4. Chronic Congestive heart failure:  Presumably diastolic. Monitor respiratory status appears compensated currently 5. Hypertension: Continue lisinopril.  6. Coronary artery disease: Stable; nonobstructive. Continue isosorbide mononitrate 8. DVT prophylaxis artery on Eliquis     Code Status Orders        Start     Ordered   11/24/14 0755  Do not attempt resuscitation (DNR)   Continuous    Question Answer Comment  In the event of cardiac or respiratory ARREST Do not call a "code blue"   In the event of cardiac or respiratory ARREST Do not perform Intubation, CPR, defibrillation or ACLS   In the event of cardiac or respiratory  ARREST Use medication by any route, position, wound care, and other measures to relive pain and suffering. May use oxygen, suction and manual treatment of airway obstruction as needed for comfort.      11/24/14 0754    Advance Directive Documentation        Most Recent Value   Type of Advance Directive  Out of facility DNR (pink MOST or yellow form)   Pre-existing out of facility DNR order (yellow form or pink MOST form)     "MOST" Form in Place?             Consults   none   DVT Prophylaxis  eliquis  Lab Results  Component Value Date   PLT 199 11/24/2014     Time Spent in minutes    65min spent   Dustin Flock M.D on 11/24/2014 at 12:18 PM  Between 7am to 6pm - Pager - [phone]  After 6pm go to [email or web address] - password [email or web address] [place] [facility]   Office  [phone]

## 2014-11-25 DIAGNOSIS — R55 Syncope and collapse: Secondary | ICD-10-CM | POA: Diagnosis not present

## 2014-11-25 LAB — BASIC METABOLIC PANEL
ANION GAP: 7 (ref 5–15)
BUN: 34 mg/dL — ABNORMAL HIGH (ref 6–20)
CHLORIDE: 108 mmol/L (ref 101–111)
CO2: 27 mmol/L (ref 22–32)
Calcium: 8.9 mg/dL (ref 8.9–10.3)
Creatinine, Ser: 1.62 mg/dL — ABNORMAL HIGH (ref 0.44–1.00)
GFR calc Af Amer: 31 mL/min — ABNORMAL LOW (ref >60)
GFR calc non Af Amer: 26 mL/min — ABNORMAL LOW (ref >60)
GLUCOSE: 113 mg/dL — AB (ref 65–99)
POTASSIUM: 4.1 mmol/L (ref 3.5–5.1)
Sodium: 142 mmol/L (ref 135–145)

## 2014-11-25 MED ORDER — MUPIROCIN 2 % EX OINT
1.0000 "application " | TOPICAL_OINTMENT | Freq: Two times a day (BID) | CUTANEOUS | Status: DC
  Administered 2014-11-25 (×2): 1 via NASAL
  Filled 2014-11-25: qty 22

## 2014-11-25 MED ORDER — FLUDROCORTISONE ACETATE 0.1 MG PO TABS
0.1000 mg | ORAL_TABLET | Freq: Every day | ORAL | Status: DC
  Administered 2014-11-25: 0.1 mg via ORAL
  Filled 2014-11-25: qty 1

## 2014-11-25 MED ORDER — MIDODRINE HCL 5 MG PO TABS: 5.0000 mg | ORAL_TABLET | Freq: Three times a day (TID) | ORAL | Status: AC

## 2014-11-25 MED ORDER — ASPIRIN 325 MG PO TABS
325.0000 mg | ORAL_TABLET | Freq: Every day | ORAL | Status: DC
  Administered 2014-11-25: 325 mg via ORAL
  Filled 2014-11-25: qty 1

## 2014-11-25 MED ORDER — MUPIROCIN 2 % EX OINT: 1.0000 "application " | TOPICAL_OINTMENT | Freq: Two times a day (BID) | CUTANEOUS | Status: AC

## 2014-11-25 MED ORDER — CHLORHEXIDINE GLUCONATE CLOTH 2 % EX PADS
6.0000 | MEDICATED_PAD | Freq: Every day | CUTANEOUS | Status: DC
  Administered 2014-11-25: 6 via TOPICAL

## 2014-11-25 NOTE — Progress Notes (Signed)
Pt is a&o, Afib on tele, VSS with no complaints of pain or discomfort. Orders to D/C pt back to [facility]. Packet prepared by SW and report called to [place] at [facility]. IV and tele removed and pt currently awaiting EMS transport for D/c.

## 2014-11-25 NOTE — Discharge Summary (Signed)
[name], 81 y.o., DOB 25-Mar-1920, MRN [id number]. Admission date: 11/24/2014 Discharge Date 11/25/2014 Primary MD Marden Noble, MD Admitting Physician Harrie Foreman, MD  Admission Diagnosis  Pleural effusion [J90]  Discharge Diagnosis   Active Problems:   Syncope with collapse Orthostatic hypotension Chronic systolic CHF Chronic constipation Asthma Scoliosis Allergic rhinitis Hyperlipidemia Degenerative disc disease Osteoarthritis Coronary artery disease      Hospital Course  The patient presented to emergency department via EMS from her nursing home where she suffered a fall. Apparently the patient had gotten up in the middle the night to use the bathroom. She has no recollection of the events prior to the fall.  Patient was admitted for syncope with collapse. On further evaluation she was noted to have orthostatic hypotension. She has had history of chronic dizziness and recurrent falls in the past. Patient has been started on midrodrine. She also had a echocardiogram of the heart which showed significant systolic dysfunction. Patient is to be careful when she gets up. Also should be help to go to the bathroom etc.            Consults  None  Significant Tests:  See full reports for all details      Ct Head Wo Contrast  11/24/2014  CLINICAL DATA:  Initial evaluation for acute trauma, fall. EXAM: CT HEAD WITHOUT CONTRAST CT CERVICAL SPINE WITHOUT CONTRAST TECHNIQUE: Multidetector CT imaging of the head and cervical spine was performed following the standard protocol without intravenous contrast. Multiplanar CT image reconstructions of the cervical spine were also generated. COMPARISON:  None. FINDINGS: CT HEAD FINDINGS Soft tissue contusion/laceration present at the left frontotemporal scalp and periorbital region. No radiopaque foreign body. Scalp soft tissues otherwise unremarkable. No acute abnormality about the orbits. Paranasal sinuses are clear.  Mastoid air cells are clear. Middle ear cavities are clear. No calvarial fracture. Diffuse prominence of the CSF containing spaces is compatible with age-related cerebral atrophy. Patchy hypodensity within the periventricular and deep white matter most consistent with chronic small vessel ischemic disease. Vascular calcifications within the carotid siphons. No acute large vessel territory infarct. No intracranial hemorrhage. No mass lesion, midline shift or mass effect. Ventricular prominence related to global parenchymal volume loss present without hydrocephalus. No extra-axial fluid collection. CT CERVICAL SPINE FINDINGS Straightening of the normal cervical lordosis. 3 mm anterolisthesis of C2 on C3 is stable. Additionally, 3 mm of anterolisthesis of C4 on C5 is also unchanged. Trace anterolisthesis of C7 on T1, unchanged. Normal C1-2 articulations are preserved. Dens is intact. No acute fracture or traumatic malalignment. Prevertebral soft tissues within normal limits. Advanced degenerative changes about the dens. Advanced degenerative spondylolysis at C3-4 through C6-7. No acute soft tissue abnormality within the visualized neck. Prominent vascular calcifications about the carotid bifurcations. Bilateral pleural effusions are partially visualized, right greater than left. Diffuse interlobular septal thickening suggestive of mild diffuse pulmonary interstitial edema. No apical pneumothorax. IMPRESSION: CT BRAIN: 1. No acute intracranial process. 2. Left frontotemporal and periorbital scalp contusion with laceration. 3. Advanced cerebral atrophy with Mild chronic small vessel ischemic disease. CT CERVICAL SPINE: 1. No acute traumatic injury within the cervical spine. 2. Multilevel degenerative spondylolysis with associated subluxations, stable as compared to previous. 3. Bilateral pleural effusions, right greater than left. Diffuse interlobular septal thickening within the visualized lung apices suggestive of  interstitial pulmonary edema. Correlation with plain film radiography recommended. Electronically Signed   By: Jeannine Boga M.D.   On: 11/24/2014 04:08   Ct  Cervical Spine Wo Contrast  11/24/2014  CLINICAL DATA:  Initial evaluation for acute trauma, fall. EXAM: CT HEAD WITHOUT CONTRAST CT CERVICAL SPINE WITHOUT CONTRAST TECHNIQUE: Multidetector CT imaging of the head and cervical spine was performed following the standard protocol without intravenous contrast. Multiplanar CT image reconstructions of the cervical spine were also generated. COMPARISON:  None. FINDINGS: CT HEAD FINDINGS Soft tissue contusion/laceration present at the left frontotemporal scalp and periorbital region. No radiopaque foreign body. Scalp soft tissues otherwise unremarkable. No acute abnormality about the orbits. Paranasal sinuses are clear. Mastoid air cells are clear. Middle ear cavities are clear. No calvarial fracture. Diffuse prominence of the CSF containing spaces is compatible with age-related cerebral atrophy. Patchy hypodensity within the periventricular and deep white matter most consistent with chronic small vessel ischemic disease. Vascular calcifications within the carotid siphons. No acute large vessel territory infarct. No intracranial hemorrhage. No mass lesion, midline shift or mass effect. Ventricular prominence related to global parenchymal volume loss present without hydrocephalus. No extra-axial fluid collection. CT CERVICAL SPINE FINDINGS Straightening of the normal cervical lordosis. 3 mm anterolisthesis of C2 on C3 is stable. Additionally, 3 mm of anterolisthesis of C4 on C5 is also unchanged. Trace anterolisthesis of C7 on T1, unchanged. Normal C1-2 articulations are preserved. Dens is intact. No acute fracture or traumatic malalignment. Prevertebral soft tissues within normal limits. Advanced degenerative changes about the dens. Advanced degenerative spondylolysis at C3-4 through C6-7. No acute soft  tissue abnormality within the visualized neck. Prominent vascular calcifications about the carotid bifurcations. Bilateral pleural effusions are partially visualized, right greater than left. Diffuse interlobular septal thickening suggestive of mild diffuse pulmonary interstitial edema. No apical pneumothorax. IMPRESSION: CT BRAIN: 1. No acute intracranial process. 2. Left frontotemporal and periorbital scalp contusion with laceration. 3. Advanced cerebral atrophy with Mild chronic small vessel ischemic disease. CT CERVICAL SPINE: 1. No acute traumatic injury within the cervical spine. 2. Multilevel degenerative spondylolysis with associated subluxations, stable as compared to previous. 3. Bilateral pleural effusions, right greater than left. Diffuse interlobular septal thickening within the visualized lung apices suggestive of interstitial pulmonary edema. Correlation with plain film radiography recommended. Electronically Signed   By: Jeannine Boga M.D.   On: 11/24/2014 04:08   Ct Shoulder Left Wo Contrast  11/24/2014  CLINICAL DATA:  Left shoulder pain after unwitnessed fall. EXAM: CT OF THE LEFT SHOULDER WITHOUT CONTRAST TECHNIQUE: Multidetector CT imaging was performed according to the standard protocol. Multiplanar CT image reconstructions were also generated. COMPARISON:  None. FINDINGS: No acute fracture or dislocation. Severe glenohumeral osteoarthritis with near complete joint space loss, subchondral cystic change, osteophytes and osseous remottling. Chronic rotator cuff tear with superior subluxation of humeral head abutting the undersurface of the acromion and fatty infiltration and atrophy of supraspinatus muscle. Periarticular bursal fluid collection and small joint effusion. Left ribs are intact. There is a small left pleural effusion. Atherosclerosis noted of the thoracic aorta which tortuous. Mild smooth septal thickening and peribronchial thickening noted. IMPRESSION: 1. No acute  fracture dislocation. 2. Advanced chronic degenerative change of the left shoulder. 3. Incidental note of left pleural effusion in the included chest. Electronically Signed   By: Jeb Levering M.D.   On: 11/24/2014 03:46   US Carotid Bilateral  11/24/2014  CLINICAL DATA:  Syncope, hypertension, hyperlipidemia, coronary artery disease, renal insufficiency, palpitations, asthma, stroke EXAM: BILATERAL CAROTID DUPLEX ULTRASOUND TECHNIQUE: Pearline Cables scale imaging, color Doppler and duplex ultrasound were performed of bilateral carotid and vertebral arteries in the neck. COMPARISON:  None FINDINGS: Criteria: Quantification of carotid stenosis is based on velocity parameters that correlate the residual internal carotid diameter with NASCET-based stenosis levels, using the diameter of the distal internal carotid lumen as the denominator for stenosis measurement. The following velocity measurements were obtained: RIGHT ICA:  80/21 cm/sec CCA:  89/21 cm/sec SYSTOLIC ICA/CCA RATIO:  1.4 DIASTOLIC ICA/CCA RATIO:  1.8 ECA:  89 cm/sec LEFT ICA:  87/22 cm/sec CCA:  19/4 cm/sec SYSTOLIC ICA/CCA RATIO:  1.5 DIASTOLIC ICA/CCA RATIO:  2.7 ECA:  71 cm/sec RIGHT CAROTID ARTERY: Tortuous RIGHT carotid system. Intimal thickening RIGHT CCA. Calcified shadowing plaques at RIGHT carotid bulb into proximal RIGHT ICA. Patient arrhythmic. Minimally turbulent blood flow on color Doppler imaging. Spectral broadening on waveform analysis. No high velocity jets. RIGHT VERTEBRAL ARTERY:  Patent, antegrade LEFT CAROTID ARTERY: Patient arrhythmic. Tortuous LEFT carotid system. Scattered calcified plaques with minimal shadowing at proximal and mid LEFT CCA. Intimal thickening LEFT CCA. Calcified shadowing plaques LEFT carotid bulb associated with turbulent flow at the bifurcation. Minimal plaque proximal LEFT ICA. Spectral broadening on waveform analysis LEFT ICA. No high velocity jets. LEFT VERTEBRAL ARTERY:  Patent, antegrade IMPRESSION:  Significant plaque formation at the carotid bifurcations. Velocity measurements however correspond to less than 50% diameter stenoses bilaterally. Grayscale images suggest a greater degree of narrowing at the RIGHT carotid bulb than suggested by velocity measurements ; consider followup CT imaging with contrast to better assess RIGHT carotid bulb/proximal RIGHT ICA narrowing. Electronically Signed   By: Lavonia Dana M.D.   On: 11/24/2014 17:30   Dg Chest Portable 1 View  11/24/2014  CLINICAL DATA:  Initial valuation for bilateral pleural effusions. EXAM: PORTABLE CHEST 1 VIEW COMPARISON:  None. FINDINGS: Moderate cardiomegaly present. Atheromatous plaque present within the aortic arch. Mediastinal silhouette within normal limits. Lungs are normally inflated. Small layering bilateral pleural effusions are present. There is mild diffuse pulmonary vascular congestion with interstitial prominence, suggesting mild diffuse pulmonary interstitial edema. No pneumothorax. No acute osseus abnormality. Severe degenerative changes about the shoulders bilaterally. IMPRESSION: 1. Cardiomegaly with diffuse pulmonary vascular congestion with indistinctness of the interstitial markings, suggesting mild diffuse pulmonary interstitial edema. 2. Small bilateral pleural effusions. Electronically Signed   By: Jeannine Boga M.D.   On: 11/24/2014 05:00       Today   Subjective:   [name]  feels well today denies any complaints  Objective:   Blood pressure 161/95, pulse 86, temperature 98.3 F (36.8 C), temperature source Oral, resp. rate 18, height 5\' 4"  (1.626 m), weight 60.328 kg (133 lb), SpO2 96 %.  .  Intake/Output Summary (Last 24 hours) at 11/25/14 1021 Last data filed at 11/25/14 0830  Gross per 24 hour  Intake    900 ml  Output     50 ml  Net    850 ml    Exam VITAL SIGNS: Blood pressure 161/95, pulse 86, temperature 98.3 F (36.8 C), temperature source Oral, resp. rate 18, height 5\' 4"   (1.626 m), weight 60.328 kg (133 lb), SpO2 96 %.  GENERAL:  81 y.o.-year-old patient lying in the bed with no acute distress.  EYES: Pupils equal, round, reactive to light and accommodation. No scleral icterus. Extraocular muscles intact.  HEENT: Head atraumatic, normocephalic. Oropharynx and nasopharynx clear.  NECK:  Supple, no jugular venous distention. No thyroid enlargement, no tenderness.  LUNGS: Normal breath sounds bilaterally, no wheezing, rales,rhonchi or crepitation. No use of accessory muscles of respiration.  CARDIOVASCULAR: S1, S2 normal. No murmurs, rubs, or  gallops.  ABDOMEN: Soft, nontender, nondistended. Bowel sounds present. No organomegaly or mass.  EXTREMITIES: No pedal edema, cyanosis, or clubbing.  NEUROLOGIC: Cranial nerves II through XII are intact. Muscle strength 5/5 in all extremities. Sensation intact. Gait not checked.  PSYCHIATRIC: The patient is alert and oriented x 3.  SKIN:  large area of ecchymosis and facial bruising left temporal area  Data Review     CBC w Diff: Lab Results  Component Value Date   WBC 7.9 11/24/2014   WBC 4.2 06/26/2013   HGB 11.4* 11/24/2014   HGB 11.0* 06/26/2013   HCT 34.4* 11/24/2014   HCT 33.1* 06/26/2013   PLT 199 11/24/2014   PLT 219 06/26/2013   LYMPHOPCT 6 11/24/2014   LYMPHOPCT 29.9 06/26/2013   MONOPCT 7 11/24/2014   MONOPCT 9.5 06/26/2013   EOSPCT 3 11/24/2014   EOSPCT 5.5 06/26/2013   BASOPCT 1 11/24/2014   BASOPCT 0.9 06/26/2013   CMP: Lab Results  Component Value Date   NA 142 11/25/2014   NA 132* 06/26/2013   K 4.1 11/25/2014   K 3.3* 06/26/2013   CL 108 11/25/2014   CL 92* 06/26/2013   CO2 27 11/25/2014   CO2 30 06/26/2013   BUN 34* 11/25/2014   BUN 92* 06/26/2013   CREATININE 1.62* 11/25/2014   CREATININE 1.67* 06/26/2013   PROT 6.2* 11/24/2014   PROT 6.6 12/18/2012   ALBUMIN 3.5 11/24/2014   ALBUMIN 3.4 12/18/2012   BILITOT 0.5 11/24/2014   BILITOT 1.6* 12/18/2012   ALKPHOS 73 11/24/2014    ALKPHOS 124 12/18/2012   AST 20 11/24/2014   AST 86* 12/18/2012   ALT 17 11/24/2014   ALT 32 12/18/2012  .  Micro Results Recent Results (from the past 240 hour(s))  MRSA PCR Screening     Status: Abnormal   Collection Time: 11/24/14  9:46 AM  Result Value Ref Range Status   MRSA by PCR POSITIVE (A) NEGATIVE Final    Comment:        The GeneXpert MRSA Assay (FDA approved for NASAL specimens only), is one component of a comprehensive MRSA colonization surveillance program. It is not intended to diagnose MRSA infection nor to guide or monitor treatment for MRSA infections. CRITICAL RESULT CALLED TO, READ BACK BY AND VERIFIED WITH: VICTORIA JOAN AT 1610 ON 11/24/14 BY [facility]         Code Status Orders        Start     Ordered   11/24/14 0755  Do not attempt resuscitation (DNR)   Continuous    Question Answer Comment  In the event of cardiac or respiratory ARREST Do not call a "code blue"   In the event of cardiac or respiratory ARREST Do not perform Intubation, CPR, defibrillation or ACLS   In the event of cardiac or respiratory ARREST Use medication by any route, position, wound care, and other measures to relive pain and suffering. May use oxygen, suction and manual treatment of airway obstruction as needed for comfort.      11/24/14 0754    Advance Directive Documentation        Most Recent Value   Type of Advance Directive  Out of facility DNR (pink MOST or yellow form)   Pre-existing out of facility DNR order (yellow form or pink MOST form)     "MOST" Form in Place?            Follow-up Information    Follow up with Nicky Pugh  B, MD In 7 days.   Specialty:  Internal Medicine   Contact information:   [address] [place] [address] [phone]       Discharge Medications     Medication List    STOP taking these medications        amLODipine-benazepril 10-40 MG capsule  Commonly known as:  LOTREL     furosemide 20 MG tablet   Commonly known as:  LASIX      TAKE these medications        acetaminophen 325 MG tablet  Commonly known as:  TYLENOL  Take 650 mg by mouth every 4 (four) hours as needed for moderate pain.     ALIGN 4 MG Caps  Take 1 capsule by mouth daily.     ALPRAZolam 0.25 MG tablet  Commonly known as:  XANAX  Take 0.25 mg by mouth 3 (three) times daily as needed for anxiety.     aluminum-magnesium hydroxide-simethicone 974-163-84 MG/5ML Susp  Commonly known as:  MAALOX  Take 30 mLs by mouth every 4 (four) hours as needed (dyspepsia).     apixaban 2.5 MG Tabs tablet  Commonly known as:  ELIQUIS  Take 2.5 mg by mouth 2 (two) times daily.     bisacodyl 10 MG suppository  Commonly known as:  DULCOLAX  Place 10 mg rectally as needed for moderate constipation.     budesonide-formoterol 80-4.5 MCG/ACT inhaler  Commonly known as:  SYMBICORT  Inhale 2 puffs into the lungs 2 (two) times daily.     calcium carbonate 500 MG chewable tablet  Commonly known as:  TUMS - dosed in mg elemental calcium  Chew 2 tablets by mouth every 6 (six) hours as needed for indigestion or heartburn.     carvedilol 6.25 MG tablet  Commonly known as:  COREG  Take 6.25 mg by mouth 2 (two) times daily with a meal.     ferrous sulfate 325 (65 FE) MG tablet  Take 325 mg by mouth daily with breakfast.     folic acid 536 MCG tablet  Commonly known as:  FOLVITE  Take 400 mcg by mouth daily.     guaiFENesin-dextromethorphan 100-10 MG/5ML syrup  Commonly known as:  ROBITUSSIN DM  Take 10 mLs by mouth every 6 (six) hours as needed for cough (congestion).     ipratropium-albuterol 0.5-2.5 (3) MG/3ML Soln  Commonly known as:  DUONEB  Take 3 mLs by nebulization every 6 (six) hours as needed (wheezing).     isosorbide mononitrate 30 MG 24 hr tablet  Commonly known as:  IMDUR  Take 30 mg by mouth daily.     lisinopril 2.5 MG tablet  Commonly known as:  PRINIVIL,ZESTRIL  Take 2.5 mg by mouth daily.      menthol-cetylpyridinium 3 MG lozenge  Commonly known as:  CEPACOL  Take 1 lozenge by mouth every 4 (four) hours as needed for sore throat.     metoCLOPramide 5 MG tablet  Commonly known as:  REGLAN  Take 5 mg by mouth 2 (two) times daily.     metolazone 2.5 MG tablet  Commonly known as:  ZAROXOLYN  Take 2.5 mg by mouth 3 (three) times a week. Monday, Wednesday, Friday     midodrine 5 MG tablet  Commonly known as:  PROAMATINE  Take 1 tablet (5 mg total) by mouth 3 (three) times daily with meals.     MIRALAX packet  Generic drug:  polyethylene glycol  Take 17 g by  mouth daily.     mupirocin ointment 2 %  Commonly known as:  BACTROBAN  Place 1 application into the nose 2 (two) times daily.     nitroGLYCERIN 0.4 MG SL tablet  Commonly known as:  NITROSTAT  Place 0.4 mg under the tongue every 5 (five) minutes as needed for chest pain.     ondansetron 4 MG tablet  Commonly known as:  ZOFRAN  Take 4 mg by mouth every 6 (six) hours as needed for nausea or vomiting.     pantoprazole 40 MG tablet  Commonly known as:  PROTONIX  Take 40 mg by mouth 2 (two) times daily.     potassium chloride 10 MEQ tablet  Commonly known as:  K-DUR,KLOR-CON  Take 20 mEq by mouth 3 (three) times daily.     PREPARATION H 1-0.25-14.4-15 % Crea  Generic drug:  Pramox-PE-Glycerin-Petrolatum  Place 1 application rectally every 8 (eight) hours as needed (hemorrhoids).     senna-docusate 8.6-50 MG tablet  Commonly known as:  Senokot-S  Take 1 tablet by mouth at bedtime.     sertraline 100 MG tablet  Commonly known as:  ZOLOFT  Take 100 mg by mouth daily.     sodium phosphate enema  Commonly known as:  FLEET  Place 1 enema rectally daily as needed (constipation). follow package directions     sucralfate 1 G tablet  Commonly known as:  CARAFATE  Take 1 g by mouth 2 (two) times daily.     torsemide 20 MG tablet  Commonly known as:  DEMADEX  Take 40 mg by mouth daily.     Vitamin D3 50000  UNITS Caps  Take 1 capsule by mouth every 28 (twenty-eight) days.           Total Time in preparing paper work, data evaluation and todays exam - 35 minutes  Dustin Flock M.D on 11/25/2014 at 10:21 AM  [facility]   Office  [phone]

## 2014-11-25 NOTE — Progress Notes (Signed)
Patient is medially stable for D/C back to [facility] today. Per [facility] admissions coordinator patient will return to her private room 7. RN will call report to Lillia Carmel and arrange EMS for transport. Clinical Education officer, museum (CSW) prepared D/C packet and faxed D/C Summary to [facility]. Patient is aware of above. CSW contacted patient's daughter Mariann Laster and made her aware of above. Please reconsult if future social work needs arise. CSW signing off.   Blima Rich, [place] [phone]

## 2014-11-25 NOTE — Discharge Instructions (Signed)
°  DIET:  Cardiac diet  DISCHARGE CONDITION:  Stable  ACTIVITY:  Activity as tolerated, with assitance  OXYGEN:  Home Oxygen: No.   Oxygen Delivery: room air  DISCHARGE LOCATION:  nursing home    ADDITIONAL DISCHARGE INSTRUCTION:patient take time to get up due to orthostatic hypotention   If you experience worsening of your admission symptoms, develop shortness of breath, life threatening emergency, suicidal or homicidal thoughts you must seek medical attention immediately by calling 911 or calling your MD immediately  if symptoms less severe.  You Must read complete instructions/literature along with all the possible adverse reactions/side effects for all the Medicines you take and that have been prescribed to you. Take any new Medicines after you have completely understood and accpet all the possible adverse reactions/side effects.   Please note  You were cared for by a hospitalist during your hospital stay. If you have any questions about your discharge medications or the care you received while you were in the hospital after you are discharged, you can call the unit and asked to speak with the hospitalist on call if the hospitalist that took care of you is not available. Once you are discharged, your primary care physician will handle any further medical issues. Please note that NO REFILLS for any discharge medications will be authorized once you are discharged, as it is imperative that you return to your primary care physician (or establish a relationship with a primary care physician if you do not have one) for your aftercare needs so that they can reassess your need for medications and monitor your lab values.

## 2014-11-25 NOTE — Clinical Social Work Note (Signed)
Clinical Social Work Assessment  Patient Details  Name: [name] MRN: [id number] Date of Birth: 1920/12/15  Date of referral:  11/25/14               Reason for consult:  Facility Placement, Other (Comment Required) (From [place] )                Permission sought to share information with:  Chartered certified accountant granted to share information::  Yes, Verbal Permission Granted  Name::      [name]::   [name]  Relationship::     Contact Information:     Housing/Transportation Living arrangements for the past 2 months:  [place] of Information:  Patient, Adult Children Patient Interpreter Needed:  None Criminal Activity/Legal Involvement Pertinent to Current Situation/Hospitalization:  No - Comment as needed Significant Relationships:  Adult Children Lives with:  Facility Resident Do you feel safe going back to the place where you live?  Yes Need for family participation in patient care:  Yes (Comment)  Care giving concerns:  Patient is a long term care resident at [facility] Worker assessment / plan: Holiday representative ([place]) received verbal consult from MD that patient is from a facility and ready to D/C today. CSW met with patient alone at bedside. Patient was alert and oriented and sitting in the bed. Patient reported that she been at [facility] for almost 2 years and is eager to return. Patient reported that she did not want to lose her private room. Patient reported that she has 2 daughters that are very supportive.   FL2 complete DNR on chart.   Employment status:  Disabled (Comment on whether or not currently receiving Disability), Retired Nurse, adult, Medicaid In [place] PT Recommendations:  Not assessed at this time Information / Referral to community resources:  [place]  Patient/Family's  Response to care: Patient is agreeable to return to [facility].   Patient/Family's Understanding of and Emotional Response to Diagnosis, Current Treatment, and Prognosis: Patient was pleasant throughout assessment and thanked CSW for visit.   Emotional Assessment Appearance:  Appears stated age Attitude/Demeanor/Rapport:    Affect (typically observed):  Accepting, Adaptable, Pleasant Orientation:  Oriented to Self, Oriented to Place, Oriented to  Time, Oriented to Situation Alcohol / Substance use:  Not Applicable Psych involvement (Current and /or in the community):  No (Comment)  Discharge Needs  Concerns to be addressed:  No discharge needs identified Readmission within the last 30 days:  No Current discharge risk:  None Barriers to Discharge:  No Barriers Identified   Sherri Freshwater, LCSW 11/25/2014, 11:47 AM

## 2014-11-25 NOTE — Progress Notes (Signed)
SUBJECTIVE: Pt states she is feeling well this morning. Denies CP, SOB or dizziness.    Filed Vitals:   11/24/14 1948 11/25/14 0443 11/25/14 0634 11/25/14 0853  BP: 128/79 142/74  161/95  Pulse: 59 55  86  Temp: 98.2 F (36.8 C) 98.3 F (36.8 C)    TempSrc: Oral Oral    Resp: 18 18    Height:      Weight:   60.328 kg (133 lb)   SpO2: 95% 96%      Intake/Output Summary (Last 24 hours) at 11/25/14 1116 Last data filed at 11/25/14 0830  Gross per 24 hour  Intake    900 ml  Output     50 ml  Net    850 ml    LABS: Basic Metabolic Panel:  Recent Labs  11/24/14 0303 11/25/14 0843  NA 138 142  K 4.3 4.1  CL 106 108  CO2 27 27  GLUCOSE 128* 113*  BUN 44* 34*  CREATININE 1.71* 1.62*  CALCIUM 9.2 8.9   Liver Function Tests:  Recent Labs  11/24/14 0303  AST 20  ALT 17  ALKPHOS 73  BILITOT 0.5  PROT 6.2*  ALBUMIN 3.5   No results for input(s): LIPASE, AMYLASE in the last 72 hours. CBC:  Recent Labs  11/24/14 0303  WBC 7.9  NEUTROABS 6.6*  HGB 11.4*  HCT 34.4*  MCV 95.0  PLT 199   Cardiac Enzymes: No results for input(s): CKTOTAL, CKMB, CKMBINDEX, TROPONINI in the last 72 hours. BNP: Invalid input(s): POCBNP D-Dimer: No results for input(s): DDIMER in the last 72 hours. Hemoglobin A1C:  Recent Labs  11/24/14 0303  HGBA1C 5.5   Fasting Lipid Panel: No results for input(s): CHOL, HDL, LDLCALC, TRIG, CHOLHDL, LDLDIRECT in the last 72 hours. Thyroid Function Tests:  Recent Labs  11/24/14 0303  TSH 3.616   Anemia Panel: No results for input(s): VITAMINB12, FOLATE, FERRITIN, TIBC, IRON, RETICCTPCT in the last 72 hours.   PHYSICAL EXAM General: Well developed, well nourished, in no acute distress HEENT:  Normocephalic and bruising on L side of face Neck:  No JVD.  Lungs: Clear bilaterally to auscultation and percussion. Heart: IRIR Abdomen: Bowel sounds are positive, abdomen soft and non-tender  Msk:  Back normal, normal gait. Normal  strength and tone for age. Extremities: No clubbing, cyanosis or edema.   Neuro: Alert and oriented X 3. Psych:  Good affect, responds appropriately  TELEMETRY: Reviewed telemetry pt in a-[id number] VR in 60s  ASSESSMENT AND PLAN:  CAD s/p PCI to LAD: Pt last seen in our office 11/22/14, c/o chest tightness occasionally. Pt and pts daughter given option to complete CCTA to evaluate CAD, but refused at that time. Currently CP free, no concerning EKG changes  Systolic CHF:Pt had echo in June of this year with EF of 39%. EF currently 20-25% Advise continuation of carvedilol and lisinpril. Consider decreasing diuretics at d/c as fall is likely 2/2 orthostatic hypotension. Consider changing lisinopril to entresto as outpatient.  Chronic a-fib: Pt is CHADS2 score of 3 (CHF, HTN, and age) but has hx recurrent falls. Had lengthy discussion with pt and pts daughter regarding decreasing AC to ASA 325mg  daily. Pts children agree with hx of recurrent falls, best decision would be to decrease AC to ASA 325mg  daily.  Syncope: CT head with no remarkable findings, CD shows b/l plaque but no stenosis. Likely 2/2 orthostatic hypotension.   Advise conservative treatment as pt is DNR and does not want  any invasive testing.     Patient and plan discussed with supervising provider, Dr. Neoma Laming, who agrees with above findings.   Kelby Fam Angelos [place], [place]  11/25/2014 11:16 AM

## 2015-02-14 ENCOUNTER — Encounter: Payer: Self-pay | Admitting: Gastroenterology

## 2015-04-02 ENCOUNTER — Emergency Department
Admission: EM | Admit: 2015-04-02 | Discharge: 2015-04-03 | Disposition: E | Payer: Medicare Other | Attending: Emergency Medicine | Admitting: Emergency Medicine

## 2015-04-02 DIAGNOSIS — E860 Dehydration: Secondary | ICD-10-CM | POA: Diagnosis not present

## 2015-04-02 DIAGNOSIS — Z7951 Long term (current) use of inhaled steroids: Secondary | ICD-10-CM | POA: Insufficient documentation

## 2015-04-02 DIAGNOSIS — Z79899 Other long term (current) drug therapy: Secondary | ICD-10-CM | POA: Insufficient documentation

## 2015-04-02 DIAGNOSIS — I10 Essential (primary) hypertension: Secondary | ICD-10-CM | POA: Diagnosis not present

## 2015-04-02 DIAGNOSIS — I469 Cardiac arrest, cause unspecified: Secondary | ICD-10-CM | POA: Insufficient documentation

## 2015-04-02 DIAGNOSIS — I4901 Ventricular fibrillation: Secondary | ICD-10-CM | POA: Diagnosis not present

## 2015-04-02 DIAGNOSIS — R197 Diarrhea, unspecified: Secondary | ICD-10-CM | POA: Insufficient documentation

## 2015-04-02 DIAGNOSIS — R1110 Vomiting, unspecified: Secondary | ICD-10-CM

## 2015-04-02 DIAGNOSIS — R112 Nausea with vomiting, unspecified: Secondary | ICD-10-CM | POA: Insufficient documentation

## 2015-04-02 MED ORDER — DEXTROSE 50 % IV SOLN
INTRAVENOUS | Status: DC | PRN
  Administered 2015-04-02: 1 via INTRAVENOUS

## 2015-04-02 MED ORDER — EPINEPHRINE HCL 0.1 MG/ML IJ SOSY
PREFILLED_SYRINGE | INTRAMUSCULAR | Status: DC | PRN
Start: 2015-04-02 — End: 2015-04-02
  Administered 2015-04-02: 1 mg via INTRAVENOUS

## 2015-04-02 MED ORDER — DEXTROSE 5 % IV SOLN
0.0000 ug/min | INTRAVENOUS | Status: DC
  Filled 2015-04-02: qty 16

## 2015-04-02 MED ORDER — ATROPINE SULFATE 1 MG/ML IJ SOLN
INTRAMUSCULAR | Status: AC | PRN
Start: 2015-04-02 — End: 2015-04-02
  Administered 2015-04-02: 1 mg via INTRAVENOUS

## 2015-04-02 MED ORDER — DOPAMINE-DEXTROSE 3.2-5 MG/ML-% IV SOLN
INTRAVENOUS | Status: AC | PRN
  Administered 2015-04-02: 20 ug/kg/min via INTRAVENOUS

## 2015-04-02 MED ORDER — SODIUM BICARBONATE 8.4 % IV SOLN
INTRAVENOUS | Status: DC | PRN
  Administered 2015-04-02 (×2): 100 meq via INTRAVENOUS

## 2015-04-02 MED ORDER — EPINEPHRINE HCL 0.1 MG/ML IJ SOSY
PREFILLED_SYRINGE | INTRAMUSCULAR | Status: AC | PRN
Start: 2015-04-02 — End: 2015-04-02
  Administered 2015-04-02 (×2): 1 mg via INTRAVENOUS

## 2015-04-02 MED ORDER — SODIUM CHLORIDE 0.9 % IV SOLN
INTRAVENOUS | Status: AC | PRN
Start: 2015-04-02 — End: 2015-04-02
  Administered 2015-04-02 (×2): 1000 mL via INTRAVENOUS

## 2015-04-02 MED FILL — Medication: Qty: 1 | Status: AC

## 2015-04-03 NOTE — Progress Notes (Signed)
°   04/12/15 0700  Clinical Encounter Type  Visited With Family  Visit Type Death  Referral From Nurse  Consult/Referral To Chaplain  Pastoral care and support for family at time of patient death. [place] Ext [phone]

## 2015-04-03 NOTE — Code Documentation (Signed)
Family at beside. Family given emotional support. 

## 2015-04-03 NOTE — ED Notes (Signed)
Body transferred to morgue in body bag

## 2015-04-03 NOTE — Code Documentation (Signed)
Patient time of death occurred at 0602. 

## 2015-04-03 NOTE — Code Documentation (Signed)
Organ procurement team notified.  Pt not a viable candidate

## 2015-04-03 NOTE — ED Provider Notes (Signed)
[facility] Emergency Department Provider Note  ____________________________________________  Time seen: Approximately 6:41 AM  I have reviewed the triage vital signs and the nursing notes.   HISTORY  Chief Complaint CPR   HPI [name] is a 82 y.o. female brought to the ED from nursing facility via EMS in cardiopulmonary arrest. Per EMS, patient was placed in nursing facility 3 days ago. Over the weekend she had continuous vomiting and diarrhea. Reports patient was in the shower being cleaned by nursing staff when she became unresponsive. Patient was pulseless without spontaneous respirations on EMS arrival. They placed a [facility] airway and initiated chest compressions. They were unable to place an IV en route.On arrival to the emergency department patient was pulseless without respirations.    Past Medical History  Diagnosis Date  . Constipation     chronic  . Asthma   . Heart murmur   . Scoliosis     severe, affects her digestion, GI function- chronic pain  . AR (allergic rhinitis)   . Palpitations   . Hyperlipidemia   . Hypertension   . Pneumonia     history of  . DDD (degenerative disc disease)     with injections  . Osteoarthritis     s/p right hip replacement  . Renal insufficiency   . CAD (coronary artery disease)     non obstructive. Cath 2008 with medical treatment    Patient Active Problem List   Diagnosis Date Noted  . Syncope 11/24/2014  . Varicose veins of legs 10/16/2011  . Rash and nonspecific skin eruption 10/16/2011  . Dizziness 07/12/2011  . Right groin pain 02/16/2011  . Urinary frequency 11/15/2010  . URINARY HESITANCY 03/07/2010  . SEROMA COMPLICATING A PROCEDURE NEC 03/07/2010  . COLONIC POLYPS, HX OF 12/06/2009  . SHOULDER PAIN, RIGHT 10/15/2009  . HYPERGLYCEMIA 10/15/2009  . CORONARY ARTERY DISEASE 05/14/2009  . CONSTIPATION 05/14/2009  . VENOUS INSUFFICIENCY 05/01/2009  . VITAMIN D DEFICIENCY 12/31/2008  .  RENAL INSUFFICIENCY 12/31/2008  . ORTHOSTATIC DIZZINESS 10/01/2008  . SYNCOPE 09/21/2008  . EDEMA 06/14/2007  . BAKER'S CYST, RIGHT KNEE 10/20/2006  . SYMPTOM, HEADACHE 08/09/2006  . FATIGUE 06/29/2006  . PALPITATIONS 06/29/2006  . DYSPNEA/SHORTNESS OF BREATH 06/29/2006  . HYPERLIPIDEMIA 06/21/2006  . HYPERTENSION 06/21/2006  . ALLERGIC RHINITIS 06/21/2006  . FIBROCYSTIC BREAST DISEASE 06/21/2006  . [place] DISEASE 06/21/2006  . INCONTINENCE, URGE 06/21/2006  . PNEUMONIA, HX OF 06/21/2006  . BREAST MASS, HX OF 06/21/2006    Past Surgical History  Procedure Laterality Date  . Abdominal hysterectomy    . Foot surgery    . Carpal tunnel release      left  . Back surgery  1999    herniated disk  . Colon surgery    . Hernia repair  2012    left inquinal, complicated by large seroma  . Tonsillectomy    . Appendectomy    . Hemorrhoid surgery    . Coronary angioplasty  2008    LAD 40%LCX okRCA 30% EF 60%  . Bladder tack    . Total hip arthroplasty  2009  . Cataract extraction  2010  . Abdominal surgery      benign fatty tumors, also took bilateral ovaries and gallbladder    Current Outpatient Rx  Name  Route  Sig  Dispense  Refill  . acetaminophen (TYLENOL) 325 MG tablet   Oral   Take 650 mg by mouth every 4 (four) hours as needed  for moderate pain.          Marland Kitchen ALPRAZolam (XANAX) 0.25 MG tablet   Oral   Take 0.25 mg by mouth 3 (three) times daily as needed for anxiety.         Marland Kitchen aluminum-magnesium hydroxide-simethicone (MAALOX) [id number] MG/5ML SUSP   Oral   Take 30 mLs by mouth every 4 (four) hours as needed (dyspepsia).         Marland Kitchen apixaban (ELIQUIS) 2.5 MG TABS tablet   Oral   Take 2.5 mg by mouth 2 (two) times daily.         . bisacodyl (DULCOLAX) 10 MG suppository   Rectal   Place 10 mg rectally as needed for moderate constipation.         . budesonide-formoterol (SYMBICORT) 80-4.5 MCG/ACT inhaler   Inhalation   Inhale 2 puffs into the  lungs 2 (two) times daily.         . calcium carbonate (TUMS - DOSED IN MG ELEMENTAL CALCIUM) 500 MG chewable tablet   Oral   Chew 2 tablets by mouth every 6 (six) hours as needed for indigestion or heartburn.         . carvedilol (COREG) 6.25 MG tablet   Oral   Take 6.25 mg by mouth 2 (two) times daily with a meal.         . Cholecalciferol (VITAMIN D3) 50000 UNITS CAPS   Oral   Take 1 capsule by mouth every 28 (twenty-eight) days.         . ferrous sulfate 325 (65 FE) MG tablet   Oral   Take 325 mg by mouth daily with breakfast.         . folic acid (FOLVITE) [id number] MCG tablet   Oral   Take 400 mcg by mouth daily.         Marland Kitchen guaiFENesin-dextromethorphan (ROBITUSSIN DM) 100-10 MG/5ML syrup   Oral   Take 10 mLs by mouth every 6 (six) hours as needed for cough (congestion).         Marland Kitchen ipratropium-albuterol (DUONEB) 0.5-2.5 (3) MG/3ML SOLN   Nebulization   Take 3 mLs by nebulization every 6 (six) hours as needed (wheezing).         . isosorbide mononitrate (IMDUR) 30 MG 24 hr tablet   Oral   Take 30 mg by mouth daily.         Marland Kitchen lisinopril (PRINIVIL,ZESTRIL) 2.5 MG tablet   Oral   Take 2.5 mg by mouth daily.         Marland Kitchen menthol-cetylpyridinium (CEPACOL) 3 MG lozenge   Oral   Take 1 lozenge by mouth every 4 (four) hours as needed for sore throat.         . metoCLOPramide (REGLAN) 5 MG tablet   Oral   Take 5 mg by mouth 2 (two) times daily.         . metolazone (ZAROXOLYN) 2.5 MG tablet   Oral   Take 2.5 mg by mouth 3 (three) times a week. Monday, Wednesday, Friday         . midodrine (PROAMATINE) 5 MG tablet   Oral   Take 1 tablet (5 mg total) by mouth 3 (three) times daily with meals.   90 tablet   0   . mupirocin ointment (BACTROBAN) 2 %   Nasal   Place 1 application into the nose 2 (two) times daily.   22 g   0   . nitroGLYCERIN (NITROSTAT) 0.4  MG SL tablet   Sublingual   Place 0.4 mg under the tongue every 5 (five) minutes as needed  for chest pain.         Marland Kitchen ondansetron (ZOFRAN) 4 MG tablet   Oral   Take 4 mg by mouth every 6 (six) hours as needed for nausea or vomiting.         . pantoprazole (PROTONIX) 40 MG tablet   Oral   Take 40 mg by mouth 2 (two) times daily.         . polyethylene glycol (MIRALAX) packet   Oral   Take 17 g by mouth daily.          . potassium chloride (K-DUR,KLOR-CON) 10 MEQ tablet   Oral   Take 20 mEq by mouth 3 (three) times daily.         . Pramox-PE-Glycerin-Petrolatum (PREPARATION H) 1-0.25-14.4-15 % CREA   Rectal   Place 1 application rectally every 8 (eight) hours as needed (hemorrhoids).         . Probiotic Product (ALIGN) 4 MG CAPS   Oral   Take 1 capsule by mouth daily.         Marland Kitchen senna-docusate (SENOKOT-S) 8.6-50 MG tablet   Oral   Take 1 tablet by mouth at bedtime.         . sertraline (ZOLOFT) 100 MG tablet   Oral   Take 100 mg by mouth daily.         . sodium phosphate (FLEET) enema   Rectal   Place 1 enema rectally daily as needed (constipation). follow package directions         . sucralfate (CARAFATE) 1 G tablet   Oral   Take 1 g by mouth 2 (two) times daily.         Marland Kitchen torsemide (DEMADEX) 20 MG tablet   Oral   Take 40 mg by mouth daily.           Allergies Aspirin; Celebrex; Detrol; Rosuvastatin; and Simvastatin  Family History  Problem Relation Age of Onset  . Hypertension Mother   . Kidney disease Mother   . Heart disease Father     MI  . Alzheimer's disease Brother   . Cancer Paternal Uncle     esophageal    Social History Social History  Substance Use Topics  . Smoking status: Never Smoker   . Smokeless tobacco: Never Used  . Alcohol Use: Yes     Comment: Very rare    Review of Systems  Constitutional: No fever/chills. Eyes: No visual changes. ENT: No sore throat. Cardiovascular: Denies chest pain. Respiratory: Denies shortness of breath. Gastrointestinal: No abdominal pain.  Positive for nausea,  vomiting and diarrhea.  No constipation. Genitourinary: Negative for dysuria. Musculoskeletal: Negative for back pain. Skin: Negative for rash. Neurological: Negative for headaches, focal weakness or numbness.  History limited by patient unresponsive; 10-point ROS otherwise negative.  ____________________________________________   PHYSICAL EXAM:  VITAL SIGNS: ED Triage Vitals  Enc Vitals Group     BP 04-13-15 0551 49/34 mmHg     Pulse Rate 2015/04/13 0551 85     Resp --      Temp --      Temp src --      SpO2 --      Weight --      Height --      Head Cir --      Peak Flow --      Pain  Score --      Pain Loc --      Pain Edu? --      Excl. in [place]? --     Constitutional: Unresponsive. No spontaneous circulation. No spontaneous respirations.  Eyes: Pupils are fixed and dilated. Head: Atraumatic. Nose: No congestion/rhinnorhea. Mouth/Throat: Mucous membranes are dry. Lips are cyanotic. Small skin tear to upper lip. Neck: No stridor.   Cardiovascular: No spontaneous circulation. Respiratory: No spontaneous respirations. Gastrointestinal: Soft and nontender. No distention. No abdominal bruits. No CVA tenderness. Musculoskeletal: Mild skin tear to dorsal right hand. No lower extremity edema.  No joint effusions. Neurologic:  Unresponsive.  Skin:  Skin is cool, mottled, dry and intact. No rash noted. Psychiatric: Unable to assess.  ____________________________________________   LABS (all labs ordered are listed, but only abnormal results are displayed)  Labs Reviewed - No data to display ____________________________________________  EKG  None ____________________________________________  RADIOLOGY  None ____________________________________________   PROCEDURES  Procedure(s) performed:   INTUBATION Performed by: Paulette Blanch  Required items: required blood products, implants, devices, and special equipment available Patient identity confirmed: provided  demographic data and hospital-assigned identification number Time out: Immediately prior to procedure a "time out" was called to verify the correct patient, procedure, equipment, support staff and site/side marked as required.  Indications: CPR  Intubation method: Glidescope Laryngoscopy   Preoxygenation: BVM  Sedatives: None Paralytic: None  Tube Size: 7.5 cuffed  Post-procedure assessment: chest rise and ETCO2 monitor Breath sounds: equal and absent over the epigastrium Tube secured with: ETT holder  Unable to obtain chest x-ray before patient expired.    Critical Care performed:   CRITICAL CARE Performed by: Paulette Blanch   Total critical care time: 45 minutes  Critical care time was exclusive of separately billable procedures and treating other patients.  Critical care was necessary to treat or prevent imminent or life-threatening deterioration.  Critical care was time spent personally by me on the following activities: development of treatment plan with patient and/or surrogate as well as nursing, discussions with consultants, evaluation of patient's response to treatment, examination of patient, obtaining history from patient or surrogate, ordering and performing treatments and interventions, ordering and review of laboratory studies, ordering and review of radiographic studies, pulse oximetry and re-evaluation of patient's condition.  ____________________________________________   INITIAL IMPRESSION / ASSESSMENT AND PLAN / ED COURSE  Pertinent labs & imaging results that were available during my care of the patient were reviewed by me and considered in my medical decision making (see chart for details).  82 year old female with recent GI illness who became unresponsive in the shower while staff was cleaning her. She arrives to the emergency department pulseless without spontaneous respirations. CPR continued. Patient was intubated, IVs placed and patient was given  resuscitative medicines per ACLS protocol. See nursing flow sheet for code details. In summary, patient received continuous CPR, resuscitative medicines and defibrillated 3 times for ventricular fibrillation. She regained a spontaneous pulse very briefly with transcutaneous pacing (under 10-15 minutes) with extremely low blood pressure before losing her pulse again. Initially during the code patient was in PEA. Bedside cardiac ultrasound revealed agonal activity. Patient's son was kept updated throughout the resuscitation process. States his mother would not wish to be on a ventilator nor which she wish to continue CPR. Patient lost pulse 1 final time. At this point patient did not have cardiac activity on the ultrasound, her pupils remained fixed and dilated and time of death pronounced at 0602. At this  time additional family members have arrived and I updated them as well. Patient will not be a medical examiner case given her elderly age as well is multiple medical comorbidities in light of recent GI illness causing severe dehydration. I was personally present to speak with family members as well as answer any lingering questions they had.  ____________________________________________   FINAL CLINICAL IMPRESSION(S) / ED DIAGNOSES  Final diagnoses:  Cardiopulmonary arrest ([place])  Dehydration  Vomiting and diarrhea  Ventricular fibrillation ([facility])      Paulette Blanch, MD 04/21/15 0900

## 2015-04-03 NOTE — Code Documentation (Signed)
Pt and room cleaned up for family visit until 0625

## 2015-04-03 DEATH — deceased
